# Patient Record
Sex: Male | Born: 1960 | Race: White | Hispanic: No | Marital: Married | Smoking: Never smoker
Health system: Southern US, Community
[De-identification: ages and names within clinical notes are randomized; demographics above are authoritative.]

## PROBLEM LIST (undated history)

## (undated) DIAGNOSIS — K219 Gastro-esophageal reflux disease without esophagitis: Secondary | ICD-10-CM

## (undated) DIAGNOSIS — Z905 Acquired absence of kidney: Secondary | ICD-10-CM

## (undated) DIAGNOSIS — E7800 Pure hypercholesterolemia, unspecified: Secondary | ICD-10-CM

## (undated) DIAGNOSIS — I10 Essential (primary) hypertension: Secondary | ICD-10-CM

## (undated) DIAGNOSIS — N433 Hydrocele, unspecified: Secondary | ICD-10-CM

## (undated) DIAGNOSIS — E785 Hyperlipidemia, unspecified: Secondary | ICD-10-CM

## (undated) DIAGNOSIS — Z8711 Personal history of peptic ulcer disease: Secondary | ICD-10-CM

## (undated) DIAGNOSIS — Z8719 Personal history of other diseases of the digestive system: Secondary | ICD-10-CM

## (undated) HISTORY — PX: OTHER SURGICAL HISTORY: SHX169

---

## 1996-04-17 HISTORY — PX: ESOPHAGOGASTRODUODENOSCOPY: SHX1529

## 2002-02-15 HISTORY — PX: NEPHRECTOMY LIVING DONOR: SUR877

## 2004-05-04 ENCOUNTER — Observation Stay (HOSPITAL_COMMUNITY): Admission: AD | Admit: 2004-05-04 | Discharge: 2004-05-05 | Payer: Self-pay | Admitting: Emergency Medicine

## 2004-05-04 HISTORY — PX: APPENDECTOMY: SHX54

## 2004-06-23 ENCOUNTER — Ambulatory Visit: Payer: Self-pay | Admitting: Internal Medicine

## 2004-09-19 ENCOUNTER — Ambulatory Visit: Payer: Self-pay | Admitting: Internal Medicine

## 2005-05-15 ENCOUNTER — Ambulatory Visit (HOSPITAL_COMMUNITY): Admission: RE | Admit: 2005-05-15 | Discharge: 2005-05-15 | Payer: Self-pay | Admitting: General Surgery

## 2005-06-13 ENCOUNTER — Emergency Department (HOSPITAL_COMMUNITY): Admission: EM | Admit: 2005-06-13 | Discharge: 2005-06-13 | Payer: Self-pay | Admitting: Emergency Medicine

## 2005-10-03 ENCOUNTER — Ambulatory Visit: Payer: Self-pay | Admitting: Internal Medicine

## 2006-01-15 ENCOUNTER — Other Ambulatory Visit: Admission: RE | Admit: 2006-01-15 | Discharge: 2006-01-15 | Payer: Self-pay | Admitting: Urology

## 2006-01-15 ENCOUNTER — Encounter (INDEPENDENT_AMBULATORY_CARE_PROVIDER_SITE_OTHER): Payer: Self-pay | Admitting: Specialist

## 2006-05-28 ENCOUNTER — Encounter (INDEPENDENT_AMBULATORY_CARE_PROVIDER_SITE_OTHER): Payer: Self-pay | Admitting: *Deleted

## 2006-05-28 ENCOUNTER — Ambulatory Visit (HOSPITAL_COMMUNITY): Admission: RE | Admit: 2006-05-28 | Discharge: 2006-05-28 | Payer: Self-pay | Admitting: Internal Medicine

## 2006-05-28 ENCOUNTER — Ambulatory Visit: Payer: Self-pay | Admitting: Internal Medicine

## 2006-05-28 HISTORY — PX: COLONOSCOPY: SHX174

## 2006-10-24 ENCOUNTER — Ambulatory Visit: Payer: Self-pay | Admitting: Internal Medicine

## 2006-11-15 ENCOUNTER — Ambulatory Visit (HOSPITAL_COMMUNITY): Admission: RE | Admit: 2006-11-15 | Discharge: 2006-11-15 | Payer: Self-pay | Admitting: Family Medicine

## 2007-10-30 ENCOUNTER — Ambulatory Visit: Payer: Self-pay | Admitting: Internal Medicine

## 2009-01-28 ENCOUNTER — Encounter: Payer: Self-pay | Admitting: Gastroenterology

## 2009-07-28 ENCOUNTER — Ambulatory Visit (HOSPITAL_COMMUNITY): Admission: RE | Admit: 2009-07-28 | Discharge: 2009-07-28 | Payer: Self-pay | Admitting: Internal Medicine

## 2009-10-29 ENCOUNTER — Encounter (INDEPENDENT_AMBULATORY_CARE_PROVIDER_SITE_OTHER): Payer: Self-pay

## 2010-05-17 NOTE — Letter (Signed)
Summary: Recall Office Visit  [facility]  [address]   [place], [place] [address]   Phone: [phone]  Fax: [phone]      October 29, 2009   Gregory George [address] [place], [place]  [address] 03/14/1961   Dear [name],   According to our records, it is time for you to schedule a follow-up office visit with [place].   At your convenience, please call [phone] to schedule an office visit. If you have any questions, concerns, or feel that this letter is in error, we would appreciate your call.   Sincerely,    Hendricks Limes LPN  [facility] Ph: [phone]   Fax: [phone]

## 2010-08-30 NOTE — Assessment & Plan Note (Signed)
NAME:  Gregory George, Gregory George                CHART#:  [id number]   DATE:  10/30/2007                       DOB:  1961/03/03   Followup gastroesophageal reflux disease.  Last seen on 10/24/2006.  He  has done well on Aciphex 20 mg orally daily.  If he misses a couple of  days, he has recurrent symptoms.  He has changes in his dietary habits  significantly.  He has cut back on his caffeine, using more fiber/rich.  He has gained 8 pound since last visit.  No odynophagia, dysphagia,  melena, or hematochezia.  Colonoscopy early 2008 for Hemoccult positive  stool.  No significant findings.  He is slated to have a repeat  colonoscopy for screening purposes, 2018.  Last EGD 1998, he had erosive  reflux esophagitis at that time.  No Barrett's, he has not had any  intercurrent illnesses.  He did notice some transient constipation may  which subsided on his own.   CURRENT MEDICATIONS:  See updated list.   ALLERGIES:  Vytorin.   PHYSICAL EXAMINATION:  GENERAL:  Today appears well.  VITAL SIGNS: Weight 193, height 6 feet, temperature 98.5, BP 112/84, and  pulse 60.  SKIN:  Warm and dry.  ABDOMEN:  Flat, positive bowel sounds, soft, nontender without  appreciable mass or organomegaly.   ASSESSMENT:  History of gastroesophageal reflux disease/erosive reflux  esophagitis symptoms responsive and dependent on Aciphex, reviewed  antireflux lifestyle/diet.   RECOMMENDATIONS:  1. We will continue Aciphex 20 mg orally daily unless some thing comes      up.  Plan is to see this nice gentleman back in 2 years.  2. We will slate him for routine screening colonoscopy, 2018.       Jonathon Bellows, M.D.  Electronically Signed     RMR/MEDQ  D:  10/30/2007  T:  10/30/2007  Job:  [id number]   cc:   Kirk Ruths, M.D.

## 2010-08-30 NOTE — Assessment & Plan Note (Signed)
NAME:  Gregory George, Gregory George                CHART#:  [id number]   DATE:  10/24/2006                       DOB:  1960-05-17   Followup GERD/gastroesophageal reflux esophagitis, altered bowel habit,  Hemoccult-positive stool.  Last seen 05/28/2006 at which time he  underwent a colonoscopy for vague, intermittent lower abdominal cramps  and green bowel movements.  Occult blood positive on digital rectal  exam done through my office.  Colonoscopy demonstrated anal pillar and  internal hemorrhoids, otherwise normal rectal diminutive left colon  polyp which the biopsy turned out to be negative.  Remainder of the  colon and the terminal ileum appeared normal.  He was started on some  Benefiber and a probiotic in the way of Activa.  He tells me that since  he has significantly modified his diet, his bowel function has  normalized.  Specifically, he has cut back to 1 cup of coffee daily  (this is a decrease in coffee by 11-15 cups in a 24 hour period).  He  eats oatmeal bars and oatmeal every day, and fresh fruit at night during  his third shift work schedule.  He takes Activa daily as well.  He has 1-  4 bowel movements daily, doing very well.  Reflux symptoms have pretty  much abolished with Aciphex 20 mg orally daily.  He has not had any  rectal bleeding.  There is no family history of GI neoplasia.  He does  takes Bentyl on occasion for bloating.  He may take 1 on the order of  every other day.  He has not had any significant intercurrent medical  illnesses.  Last EGD was in 1998 and he was found to have mild erosive  reflux esophagitis.   CURRENT MEDICATIONS:  See updated list.   ALLERGIES:  VYTORIN.   PHYSICAL EXAMINATION:  He looks well.  Weight 175 which is actually up 6 pounds from 1 year ago.  Height 6  feet, temp 98.2, BP 120/82, pulse 60.  CHEST:  Clear to auscultation.  CARDIAC EXAM:  Regular rate and rhythm without murmurs, gallops, or  rubs.  ABDOMEN:  Flat, positive bowel sounds,  soft, nontender without  appreciable mass or organomegaly.   ASSESSMENT:  Altered bowel function now normalized with dramatic  decrease in caffeine consumption.  He has increased daily fiber intake.  He was commended for his accomplishments in these areas.  Gastroesophageal reflux disease symptoms are well controlled on Aciphex.  He may be able to drop back to 1 every other day, or actually 2  __________ on a p.r.n. basis with his markedly improved diet.  I have  given him a refill on  Aciphex 20 mg daily #30 for 1 year.  Unless something comes up, plan to  see this nice gentleman back in 1 year and see how he is doing.       Jonathon Bellows, M.D.  Electronically Signed     RMR/MEDQ  D:  10/24/2006  T:  10/24/2006  Job:  [id number]   cc:   Kirk Ruths, M.D.

## 2010-09-02 NOTE — Op Note (Signed)
NAME:  Gregory George, Gregory George               ACCOUNT NO.:  [id number]   MEDICAL RECORD NO.:  [id number]          PATIENT TYPE:  AMB   LOCATION:  DAY                           FACILITY:  [facility]   PHYSICIAN:  Dalia Heading, M.D.  DATE OF BIRTH:  Aug 01, 1960   DATE OF PROCEDURE:  05/15/2005  DATE OF DISCHARGE:                                 OPERATIVE REPORT   PREOPERATIVE DIAGNOSIS:  Left inguinal hernia.   POSTOPERATIVE DIAGNOSIS:  Left inguinal hernia, direct.   PROCEDURE:  Left inguinal herniorrhaphy.   SURGEON:  Dalia Heading, M.D.   ANESTHESIA:  General endotracheal.   INDICATIONS:  The patient is a 50 year old, white male who presents with a  symptomatic left inguinal hernia. The risks and benefits of the procedure  including bleeding, infection, pain, and recurrence of the hernia were fully  explained to the patient, who gave informed consent.   PROCEDURE NOTE:  The patient was placed in supine position. After induction  of general endotracheal anesthesia, the left groin region was prepped and  draped using the usual sterile technique with Betadine. Surgical site  confirmation was performed.   A transverse incision was made in the left groin region down to the external  oblique aponeurosis.  The aponeurosis was incised to the external ring. The  ilioinguinal nerve was identified and retracted superiorly from the  operative field. A Penrose drain was placed around the spermatic cord. A  direct hernia was found. This was incised at its base and inverted. A large-  size polypropylene mesh plug was then placed into this region secured  circumferentially to the transversalis fascia using 2-0 Novofil interrupted  sutures. An onlay polypropylene mesh patch was then placed along the floor  of the inguinal canal and secured superiorly to the conjoined tendon and  inferiorly to the shelving edge Poupart's ligament using 2-0 Novofil  interrupted sutures. The internal ring was recreated using  a 2-0 Novofil  interrupted suture. The external oblique aponeurosis was reapproximated  using a 2-0 Vicryl running suture. The subcutaneous layer was reapproximated  using a 3-0 Vicryl interrupted suture. The skin was closed using a 4-0  Vicryl subcuticular suture. Then 0.5 cm Sensorcaine was instilled into the  surrounding wound. Dermabond was then applied.   All tape and needle counts were correct at the end of the procedure. The  patient was extubated in the operating room and went back to recovery room  awake in stable condition.   COMPLICATIONS:  None.   SPECIMEN:  None.   BLOOD LOSS:  Minimal.      Dalia Heading, M.D.  Electronically Signed     MAJ/MEDQ  D:  05/15/2005  T:  05/15/2005  Job:  [id number]   cc:   Patrica Duel, M.D.  Fax: [phone]

## 2010-09-02 NOTE — Op Note (Signed)
NAME:  Gregory George, Gregory George NO.:  [id number]   MEDICAL RECORD NO.:  [id number]          PATIENT TYPE:  EMS   LOCATION:  ED                            FACILITY:  [facility]   PHYSICIAN:  Dalia Heading, M.D.  DATE OF BIRTH:  May 20, 1960   DATE OF PROCEDURE:  05/04/2004  DATE OF DISCHARGE:                                 OPERATIVE REPORT   PREOPERATIVE DIAGNOSIS:  Acute appendicitis.   POSTOPERATIVE DIAGNOSIS:  Acute appendicitis.   OPERATION/PROCEDURE:  Laparoscopic appendectomy.   SURGEON:  Dalia Heading, M.D.   ANESTHESIA:  General endotracheal anesthesia.   INDICATIONS:  The patient is a 50 year old white male who presented with  right lower quadrant abdominal pain to the emergency room. CT scan of the  abdomen and pelvis was performed which revealed  acute appendicitis.  The  patient now comes to the operating room for laparoscopic appendectomy.  The  risks and benefits for the procedure including bleeding, infection and the  possibility of an open procedure were fully explained to the patient who  gave informed consent.   DESCRIPTION OF PROCEDURE:  The patient was placed in the supine position.  After induction of general endotracheal anesthesia, the abdomen was prepped  and draped using the usual sterile technique with Betadine.  Surgical site  confirmation was performed.   A subumbilical incision was made down to the fascia.  A Veress needle was  introduced into the abdominal cavity and confirmation of placement was done  with the saline drop test.  The abdomen was then insufflated to 16 mmHg  pressure.  An 11 mm trocar was introduced into the abdominal under direct  visualization without difficulty.  The patient was placed in deeper  Trendelenburg position.  An additional 12 mm trocar was placed in the  suprapubic region.  A 5 mm trocar was placed in the left lower quadrant  region.  The appendix was visualized and noted to be inflamed along its  distal  two-thirds.  The mesoappendix was divided using the Harmonic scalpel.  A vascular endo-GIA was placed across the base of the appendix and fired.  The appendix was delivered through the suprapubic trocar site using an  EndoCatch bag.  The staple line and mesentery were inspected and no abnormal  bleeding or purulent drainage was noted.  All fluid and air were then  evacuated from the abdominal cavity prior to removal of the trocars.   All wounds were irrigated with normal saline.  All wounds were injected with  0.5% Sensorcaine.  The subumbilical fascia as well as the suprapubic fascia  reapproximated using an 0 Vicryl interrupted sutures.  All skin incisions  were closed using staples.  Betadine ointment and dry sterile dressings were  applied.  All tape and needle counts were correct at the end of the  procedure.  The patient was extubated in the operating room, went back to  the recovery room, awake, in stable condition.   COMPLICATIONS:  None.   SPECIMENS:  Appendix.   ESTIMATED BLOOD LOSS:  Minimal.     Gregory George   MAJ/MEDQ  D:  05/04/2004  T:  05/05/2004  Job:  [id number]   cc:   Patrica Duel, M.D.  [address]  [place]  [place] [address]  Fax: [phone]

## 2010-09-02 NOTE — Op Note (Signed)
NAME:  Gregory George, Gregory George               ACCOUNT NO.:  [id number]   MEDICAL RECORD NO.:  [id number]          PATIENT TYPE:  AMB   LOCATION:  DAY                           FACILITY:  [facility]   PHYSICIAN:  R. Roetta Sessions, M.D. DATE OF BIRTH:  04/03/1961   DATE OF PROCEDURE:  05/28/2006  DATE OF DISCHARGE:                               OPERATIVE REPORT   PROCEDURE:  Colonoscopy, cold biopsy.   INDICATIONS FOR PROCEDURE:  The patient is a 50 year old gentleman with  nonspecific GI symptoms including intermittent lower abdominal cramps,  intermittently green bowel movements.  He really denies any frank  diarrhea, occasional nausea but no vomiting. Gastroesophageal reflux  disease symptoms well-controlled.  He has gained weight recently.  He  was hemoccult positive on digital rectal exam by me in my office  recently.  Colonoscopy is now being done.  This approach has been  discussed with the patient at length.  Potential risks, benefits and  alternatives have been reviewed, questions answered.  He is agreeable.  Please note that he has never had his lower GI tract evaluated directly  optically previously he did have a barium enema in the past.  There is  no family history of colorectal neoplasia.   PROCEDURE NOTE:  O2 saturation, blood pressure, pulse and respirations  were monitored throughout the entire procedure.   CONSCIOUS SEDATION:  Versed 3 mg IV, Demerol 75 mg IV in divided doses.   INSTRUMENT:  Pentax video chip system.   FINDINGS:  Digital rectal exam revealed no abnormalities.   ENDOSCOPIC FINDINGS:  The prep was adequate.  The colonic mucosa was  surveyed from the rectosigmoid junction.  The scope was advanced through  the left, transverse, right colon to the area of the appendiceal  orifice, ileocecal valve and cecum.  These structures well seen  photographed for the record.  From this level scope was withdrawn.  All  previously mentioned mucosal surfaces were again seen.  The  patient had  two 3 mm polyps in the mid descending colon.  Remainder of colon mucosa  appeared normal.  These polyps were cold biopsied/removed.  The terminal  ileum was intubated to 15 cm.  This segment of the GI tract also  appeared normal.  The remainder of the colonic mucosa appeared entirely  normal.  Scope was pulled down into the rectum where a thorough  examination of the rectal mucosa including a retroflex view of the anal  verge was undertaken.  The patient had anal papilla and minimal internal  hemorrhoids, otherwise the rectal mucosa appeared normal.  The patient  tolerated the procedure well, was reacted in endoscopy.   IMPRESSION:  Anal papilla and internal hemorrhoids, otherwise normal  rectum.  Diminutive left colon polyps removed as described above.  Otherwise normal colon, normal terminal ileum.   Suspect the patient has an element of irritable bowel syndrome.   RECOMMENDATIONS:  1. Follow-up on path.  2. Begin and daily Benefiber fiber supplement.  Also will begin      probiotic in the way of __________  one  capsule daily.  Will  follow-up on path.  Will arrange for this nice gentleman come back      to see [place] in 3 months so we can seem how he is doing.      Gregory George, M.D.  Electronically Signed     RMR/MEDQ  D:  05/28/2006  T:  05/28/2006  Job:  [id number]   cc:   Corrie Mckusick, M.D.  Fax: [phone]

## 2010-09-02 NOTE — H&P (Signed)
NAME:  Gregory George, Gregory George               ACCOUNT NO.:  [id number]   MEDICAL RECORD NO.:  [id number]          PATIENT TYPE:  AMB   LOCATION:                                FACILITY:  [facility]   PHYSICIAN:  Dalia Heading, M.D.  DATE OF BIRTH:  [birth year]   DATE OF ADMISSION:  DATE OF DISCHARGE:  LH                                HISTORY & PHYSICAL   CHIEF COMPLAINT:  Left inguinal hernia.   HISTORY OF PRESENT ILLNESS:  The patient is a 50 year old white male who  presents with a left inguinal hernia.  It is increasing in size and is  causing him discomfort when straining.  No nausea or vomiting have been  noted.   PAST MEDICAL HISTORY:  Unremarkable.   PAST SURGICAL HISTORY:  Laparoscopic appendectomy in 2006.   CURRENT MEDICATIONS:  None.   ALLERGIES:  No known drug allergies.   REVIEW OF SYSTEMS:  Noncontributory.   PHYSICAL EXAMINATION:  GENERAL:  The patient is a well-developed, well-  nourished, white male in no acute distress.  LUNGS:  Clear to auscultation with equal breath sounds bilaterally.  HEART:  Reveals a regular rate and rhythm without S3, S4, or murmurs.  ABDOMEN:  Soft, nontender, nondistended.  No hepatosplenomegaly or masses  are noted.  A reducible left inguinal hernia is noted.  No right inguinal  hernia is noted.   IMPRESSION:  Left inguinal hernia.   PLAN:  The patient is scheduled for a left inguinal herniorrhaphy on May 15, 2005.  The risks and benefits of the procedure including bleeding,  infection, pain, and recurrence of the hernia were fully explained to the  patient, gave informed consent.      Dalia Heading, M.D.  Electronically Signed     MAJ/MEDQ  D:  04/25/2005  T:  04/25/2005  Job:  [id number]   cc:   Dalia Heading, M.D.  Fax: [phone]   [facility]  Fax: [phone]   Patrica Duel, M.D.  Fax: [phone]

## 2010-09-02 NOTE — H&P (Signed)
NAME:  Gregory George, Gregory George               ACCOUNT NO.:  [id number]   MEDICAL RECORD NO.:  [id number]          PATIENT TYPE:  AMB   LOCATION:                                FACILITY:  [facility]   PHYSICIAN:  R. Roetta Sessions, M.D. DATE OF BIRTH:  [birth year]   DATE OF ADMISSION:  DATE OF DISCHARGE:  LH                              HISTORY & PHYSICAL   REASON FOR CONSULTATION:  Abdominal pain, change in bowel function.   HISTORY OF PRESENT ILLNESS:  Gregory George is a 50 year old  Caucasian male sent over at the courtesy of Dr. Phillips Odor to further  evaluate recent change in bowel function.  He is well known to me given  his history of gastroesophageal reflux disease.   He tells me around early December 2007 he started having vague lower  abdominal cramps from time to time, intermittent nausea but has never  vomited.  Had some increased belching and flatulence along the way too.  Noted some dark green stools from time to time, nothing that he really  describes as melena.  He has not had any hematochezia.  He has  intermittent abdominal cramps, not necessarily relieved with a bowel  movement.   He has GAINED 9 pounds since his last weigh-in here in June, 2007.  He  has had a barium enema in years past, was diagnosed with irritable bowel  back in the 1970's.  He had an air contrast barium enema back at that  time.  He has never had his lower GI tract imaged optically.   Reflux symptoms continue to be well controlled on Aciphex 20 mg orally  daily.  Dr. 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  He was  started on Bentyl which has made not much difference in his symptoms as  he only takes it 1-2 times daily.  He has been taking over-the-counter  fiber supplement on a daily basis.   He denies odynophagia, dysphagia, early satiety or vomiting.  No  one  else around him was sick recently.  He has had no fever or chills.   PAST MEDICAL HISTORY:  Significant for:  1. Gastroesophageal reflux disease.  2. Irritable bowel syndrome.  3. History of hyperlipidemia.   PAST SURGERIES:  1. Urethral surgery as an infant.  2. Vasectomy October of 2007.  3. Laparoscopic appendectomy for appendicitis back in 2006, Dr.      Lovell Sheehan.   CURRENT MEDICATIONS:  1. Multivitamin daily.  2. Vitamin C daily.  3. Tylenol p.r.n.  4. ASA 81 mg daily.  5. Aciphex 20 mg daily.  6. Zolpidem q.h.s.  7. Bentyl p.r.n.   ALLERGIES:  VYTORIN.   FAMILY HISTORY:  1. Negative for chronic GI or liver illness.  2. No history of ulcerative colitis, Crohn's or GI cancer      specifically.   SOCIAL HISTORY:  1. Patient is married and has one son and one daughter.  2. He works third  shift at __________.  3. He stopped smoking April 17, 2006.  4. He does not use alcohol or illicit drugs.   REVIEW OF SYSTEMS:  No chest pain, no dyspnea on exertion.  No fever or  chills.  Otherwise as in History of Present Illness.   PHYSICAL EXAMINATION:  Reveals a pleasant 50 year old gentleman resting  comfortably.  He is bearded.  Weight 178, height 6 feet, temperature  98.5, BP 120/84, pulse 60.  Skin warm and dry, there is no jaundice or  __________ stigmata of chronic liver disease.  HEENT:  No scleral icterus.  Conjunctiva are pink, oral cavity no  lesions.  CHEST:  Lungs are clear to auscultation.  ABDOMEN:  Nondistended.  Positive bowel sounds, soft.  He has minimal  right lower quadrant tenderness to palpation, no appreciable mass or  organomegaly.  EXTREMITY:  No edema.  RECTAL:  Good sphincter tone, no mass in the rectal vault.  Scant brown  stool in the rectal vault is hemoccult positive.   IMPRESSION:  Gregory George is a pleasant 50 year old gentleman with  vague nonspecific GI symptoms as outlined above.  These symptoms have  occurred recently.  They  do not really fall into any classic or typical  IBS profile.  He is hemoccult positive.  He may have had a recent  foodborne illness which was self-limiting.  At any rate, he needs to  have his lower GI tract evaluated via colonoscopy.  I have discussed  this approach with Gregory George.  The potential risks, benefits and  alternatives have been reviewed, questions answered, he is agreeable.   He is concerned about history of elevated creatinine given a solitary  kidney.  He asked that I recheck labs on him today.  Will go ahead and  do a Chem 20 and a CBC and see how things look.  I will make further  recommendations in the very near future.  He was commended for smoking  cessation.   I would like to thank Dr. Geanie Cooley for allowing me to see this nice  gentleman.      Gregory George, M.D.  Electronically Signed     RMR/MEDQ  D:  05/22/2006  T:  05/22/2006  Job:  [id number]   cc:   Corrie Mckusick, M.D.  Fax: [phone]

## 2010-09-14 ENCOUNTER — Other Ambulatory Visit (HOSPITAL_COMMUNITY): Payer: Self-pay | Admitting: Family Medicine

## 2010-09-14 ENCOUNTER — Ambulatory Visit (HOSPITAL_COMMUNITY)
Admission: RE | Admit: 2010-09-14 | Discharge: 2010-09-14 | Disposition: A | Discharge status: Home / Self Care | Payer: 59 | Source: Ambulatory Visit | Attending: Family Medicine | Admitting: Family Medicine

## 2010-09-14 DIAGNOSIS — N433 Hydrocele, unspecified: Secondary | ICD-10-CM | POA: Insufficient documentation

## 2010-09-14 DIAGNOSIS — N508 Other specified disorders of male genital organs: Secondary | ICD-10-CM

## 2010-09-14 DIAGNOSIS — N509 Disorder of male genital organs, unspecified: Secondary | ICD-10-CM | POA: Insufficient documentation

## 2012-02-02 ENCOUNTER — Other Ambulatory Visit: Payer: Self-pay | Admitting: Urology

## 2012-02-21 ENCOUNTER — Encounter (HOSPITAL_BASED_OUTPATIENT_CLINIC_OR_DEPARTMENT_OTHER): Payer: Self-pay | Admitting: *Deleted

## 2012-02-23 ENCOUNTER — Encounter (HOSPITAL_BASED_OUTPATIENT_CLINIC_OR_DEPARTMENT_OTHER): Payer: Self-pay | Admitting: *Deleted

## 2012-02-23 NOTE — H&P (Signed)
History of Present Illness   Gregory George presents today as a referral from [facility] for possible surgical correction of a left hydrocele. Gregory George is 51 years of age. Only urologic history is a remote history of vasectomy. The patient also has had a left inguinal hernia repair. Approximately a year to a year-and-half ago, he began noticing some left-sided scrotal swelling. An ultrasound was performed, which documented a left-sided hydrocele. This actually got smaller for a while, but then subsequently has increased in size. 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 He is interested in Air cabin crew.      Past Medical History Problems  1. History of  Gastric Cancer V10.04 2. History of  Heartburn 787.1 3. History of  Hyperlipidemia 272.4  Surgical History Problems  1. History of  Appendectomy 2. 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 History of  Hernia Repair 4. History of  Surgery Of Male Genitalia Vasectomy V25.2  Current Meds 1. Aciphex 20 MG Oral Tablet Delayed Release; Therapy: (Recorded:14Oct2013) to 2. Adult Aspirin Low Strength 81 MG Oral Tablet Dispersible; Therapy: (Recorded:14Oct2013) to 3. Ambien 10 MG Oral Tablet; Therapy: (Recorded:14Oct2013) to 4. Fish Oil 1000 MG Oral Capsule; Therapy: (Recorded:14Oct2013) to 5. Simvastatin 20 MG Oral Tablet; Therapy: (Recorded:14Oct2013) to 6. Vitamin B Complex Oral Tablet; Therapy: (Recorded:14Oct2013) to 7. Vitamin C 500 MG Oral Tablet; Therapy: (Recorded:14Oct2013) to  Allergies Medication  1. Vytorin TABS  Family History Problems  1. Paternal grandmother's history of  Alzheimer's Disease 2. Paternal history of  Family Health Status - Father's Age 80yrs 3. Maternal history of  Family Health Status - Mother's Age 59yrs 4. Family history of  Family Health Status Number Of Children 1 son and 1 daughter 5. Paternal history of  Hypertension  V17.49 6. Paternal grandmother's history of  Hypertension V17.49 7. Maternal history of  Nephrolithiasis 8. Paternal history of  Renal Failure 9. 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  Genitourinary: testicular pain and scrotal swelling.  Gastrointestinal: heartburn and constipation.  Constitutional: feeling tired (fatigue).  Musculoskeletal: back pain.    Vitals Vital Signs [Data Includes: Last 1 Day]  14Oct2013 02:06PM  BMI Calculated: 24.24 BSA Calculated: 2.03 Height: 6 ft  Weight: 179 lb  Blood Pressure: 138 / 82 Temperature: 98.2 F Heart Rate: 67  Physical Exam Constitutional: Well nourished and well developed . No acute distress.  ENT:. The ears and nose are normal in appearance.  Neck: The appearance of the neck is normal and no neck mass is present.  Pulmonary: No respiratory distress and normal respiratory rhythm and effort.  Cardiovascular: Heart rate and rhythm are normal . No peripheral edema.  Abdomen: The abdomen is soft and nontender. No masses are palpated. No CVA tenderness. No hernias are palpable. No hepatosplenomegaly noted.  Genitourinary: Examination of the penis demonstrates no discharge, no masses, no lesions and a normal meatus. The scrotum is without lesions. Examination of the left scrotum demostrates a hydrocele. The right epididymis is palpably normal and  non-tender. The left epididymis is palpably normal and non-tender. The right testis is non-tender and without masses.  The left testis is nonpalpable, non-tender and without masses.  Skin: Normal skin turgor, no visible rash and no visible skin lesions.  Neuro/Psych:. Mood and affect are appropriate.    Results/Data Urine [Data Includes: Last 1 Day]   14Oct2013  COLOR YELLOW   APPEARANCE CLEAR   SPECIFIC GRAVITY 1.020   pH 6.0   GLUCOSE NEG mg/dL  BILIRUBIN NEG   KETONE NEG mg/dL  BLOOD NEG   PROTEIN NEG mg/dL  UROBILINOGEN 0.2 mg/dL  NITRITE NEG   LEUKOCYTE ESTERASE NEG    Assessment Assessed  1. Testicular Pain 608.9 2. Testicular Hydrocele 603.9  Plan Health Maintenance (V70.0)  1. UA With REFLEX  Done: 14Oct2013 01:51PM Testicular Hydrocele (603.9)  2. Follow-up Schedule Surgery Office  Follow-up  Requested for: 14Oct2013  Discussion/Summary   Gregory George has a symptomatic small to medium sized hydrocele. This does bother him, and it is impacting him on a work as well as a quality-of-life basis. He understands that medically nothing has to be done, but given the symptomatic status, surgical correction is certainly reasonable. We discussed the procedure, typical recovery, pros and cons of this approach. He is interested in scheduling something, which we will attempt to do sometime in the next several weeks.

## 2012-02-23 NOTE — Progress Notes (Signed)
NPO AFTER MN. ARRIVES AT 0900. NEEDS HG AND EKG. WILL TAKE ZANTAC AM OF SURG W/ SIP OF WATER.

## 2012-02-26 ENCOUNTER — Ambulatory Visit (HOSPITAL_BASED_OUTPATIENT_CLINIC_OR_DEPARTMENT_OTHER): Payer: 59 | Admitting: Anesthesiology

## 2012-02-26 ENCOUNTER — Other Ambulatory Visit: Payer: Self-pay

## 2012-02-26 ENCOUNTER — Encounter (HOSPITAL_BASED_OUTPATIENT_CLINIC_OR_DEPARTMENT_OTHER): Payer: Self-pay | Admitting: Anesthesiology

## 2012-02-26 ENCOUNTER — Ambulatory Visit (HOSPITAL_BASED_OUTPATIENT_CLINIC_OR_DEPARTMENT_OTHER)
Admission: RE | Admit: 2012-02-26 | Discharge: 2012-02-26 | Disposition: A | Discharge status: Home / Self Care | Payer: 59 | Source: Ambulatory Visit | Attending: Urology | Admitting: Urology

## 2012-02-26 ENCOUNTER — Encounter (HOSPITAL_BASED_OUTPATIENT_CLINIC_OR_DEPARTMENT_OTHER): Payer: Self-pay | Admitting: *Deleted

## 2012-02-26 ENCOUNTER — Encounter (HOSPITAL_BASED_OUTPATIENT_CLINIC_OR_DEPARTMENT_OTHER)
Admission: RE | Disposition: A | Discharge status: Home / Self Care | Payer: Self-pay | Source: Ambulatory Visit | Attending: Urology

## 2012-02-26 DIAGNOSIS — Z79899 Other long term (current) drug therapy: Secondary | ICD-10-CM | POA: Insufficient documentation

## 2012-02-26 DIAGNOSIS — Z85028 Personal history of other malignant neoplasm of stomach: Secondary | ICD-10-CM | POA: Insufficient documentation

## 2012-02-26 DIAGNOSIS — N433 Hydrocele, unspecified: Secondary | ICD-10-CM

## 2012-02-26 DIAGNOSIS — E785 Hyperlipidemia, unspecified: Secondary | ICD-10-CM | POA: Insufficient documentation

## 2012-02-26 HISTORY — DX: Hyperlipidemia, unspecified: E78.5

## 2012-02-26 HISTORY — DX: Gastro-esophageal reflux disease without esophagitis: K21.9

## 2012-02-26 HISTORY — PX: HYDROCELE EXCISION: SHX482

## 2012-02-26 HISTORY — DX: Personal history of other diseases of the digestive system: Z87.19

## 2012-02-26 HISTORY — DX: Hydrocele, unspecified: N43.3

## 2012-02-26 HISTORY — DX: Personal history of peptic ulcer disease: Z87.11

## 2012-02-26 HISTORY — DX: Acquired absence of kidney: Z90.5

## 2012-02-26 SURGERY — HYDROCELECTOMY
Anesthesia: General | Site: Scrotum | Laterality: Left | Wound class: Clean

## 2012-02-26 MED ORDER — LACTATED RINGERS IV SOLN
INTRAVENOUS | Status: DC
  Administered 2012-02-26: 09:00:00 via INTRAVENOUS
  Filled 2012-02-26: qty 1000

## 2012-02-26 MED ORDER — CEFAZOLIN SODIUM 1-5 GM-% IV SOLN
1.0000 g | INTRAVENOUS | Status: DC
  Filled 2012-02-26: qty 50

## 2012-02-26 MED ORDER — LIDOCAINE HCL (CARDIAC) 20 MG/ML IV SOLN
INTRAVENOUS | Status: DC | PRN
  Administered 2012-02-26: 100 mg via INTRAVENOUS

## 2012-02-26 MED ORDER — CEFAZOLIN SODIUM-DEXTROSE 2-3 GM-% IV SOLR
2.0000 g | INTRAVENOUS | Status: AC
  Administered 2012-02-26: 2 g via INTRAVENOUS
  Filled 2012-02-26: qty 50

## 2012-02-26 MED ORDER — BUPIVACAINE HCL (PF) 0.25 % IJ SOLN
INTRAMUSCULAR | Status: DC | PRN
  Administered 2012-02-26: 5 mL

## 2012-02-26 MED ORDER — LACTATED RINGERS IV SOLN: INTRAVENOUS | Status: DC

## 2012-02-26 MED ORDER — FENTANYL CITRATE 0.05 MG/ML IJ SOLN
INTRAMUSCULAR | Status: DC | PRN
  Administered 2012-02-26 (×3): 50 ug via INTRAVENOUS

## 2012-02-26 MED ORDER — HYDROCODONE-ACETAMINOPHEN 5-325 MG PO TABS: 1.0000 | ORAL_TABLET | Freq: Four times a day (QID) | ORAL | Status: DC | PRN

## 2012-02-26 MED ORDER — DEXAMETHASONE SODIUM PHOSPHATE 4 MG/ML IJ SOLN
INTRAMUSCULAR | Status: DC | PRN
  Administered 2012-02-26: 10 mg via INTRAVENOUS

## 2012-02-26 MED ORDER — FENTANYL CITRATE 0.05 MG/ML IJ SOLN: 25.0000 ug | INTRAMUSCULAR | Status: DC | PRN

## 2012-02-26 MED ORDER — ONDANSETRON HCL 4 MG/2ML IJ SOLN
INTRAMUSCULAR | Status: DC | PRN
  Administered 2012-02-26: 4 mg via INTRAVENOUS

## 2012-02-26 MED ORDER — MIDAZOLAM HCL 5 MG/5ML IJ SOLN
INTRAMUSCULAR | Status: DC | PRN
  Administered 2012-02-26: 2 mg via INTRAVENOUS

## 2012-02-26 MED ORDER — PROPOFOL 10 MG/ML IV BOLUS
INTRAVENOUS | Status: DC | PRN
  Administered 2012-02-26: 200 mg via INTRAVENOUS

## 2012-02-26 SURGICAL SUPPLY — 48 items
APL SKNCLS STERI-STRIP NONHPOA (GAUZE/BANDAGES/DRESSINGS) ×1
BANDAGE GAUZE ELAST BULKY 4 IN (GAUZE/BANDAGES/DRESSINGS) ×2 IMPLANT
BENZOIN TINCTURE PRP APPL 2/3 (GAUZE/BANDAGES/DRESSINGS) ×2 IMPLANT
BLADE SURG 10 STRL SS (BLADE) ×2 IMPLANT
BLADE SURG 15 STRL LF DISP TIS (BLADE) ×1 IMPLANT
BLADE SURG 15 STRL SS (BLADE) ×2
BLADE SURG ROTATE 9660 (MISCELLANEOUS) ×1 IMPLANT
CANISTER SUCTION 1200CC (MISCELLANEOUS) IMPLANT
CANISTER SUCTION 2500CC (MISCELLANEOUS) ×1 IMPLANT
CLEANER CAUTERY TIP 5X5 PAD (MISCELLANEOUS) ×1 IMPLANT
CLOTH BEACON ORANGE TIMEOUT ST (SAFETY) ×2 IMPLANT
COVER MAYO STAND STRL (DRAPES) ×2 IMPLANT
COVER TABLE BACK 60X90 (DRAPES) ×2 IMPLANT
DRAIN PENROSE 18X1/4 LTX STRL (WOUND CARE) IMPLANT
DRAPE PED LAPAROTOMY (DRAPES) ×2 IMPLANT
DRSG TEGADERM 2-3/8X2-3/4 SM (GAUZE/BANDAGES/DRESSINGS) IMPLANT
ELECT NDL TIP 2.8 STRL (NEEDLE) ×1 IMPLANT
ELECT NEEDLE TIP 2.8 STRL (NEEDLE) ×2 IMPLANT
ELECT REM PT RETURN 9FT ADLT (ELECTROSURGICAL) ×2
ELECTRODE REM PT RTRN 9FT ADLT (ELECTROSURGICAL) ×1 IMPLANT
GLOVE BIO SURGEON STRL SZ7.5 (GLOVE) ×2 IMPLANT
GLOVE BIOGEL M 6.5 STRL (GLOVE) ×1 IMPLANT
GLOVE BIOGEL PI IND STRL 6.5 (GLOVE) IMPLANT
GLOVE BIOGEL PI INDICATOR 6.5 (GLOVE) ×2
GOWN W/COTTON TOWEL STD LRG (GOWNS) ×2 IMPLANT
GOWN XL W/COTTON TOWEL STD (GOWNS) ×2 IMPLANT
IV NS 500ML (IV SOLUTION) ×2
IV NS 500ML BAXH (IV SOLUTION) IMPLANT
NDL HYPO 25X1 1.5 SAFETY (NEEDLE) ×1 IMPLANT
NEEDLE HYPO 25X1 1.5 SAFETY (NEEDLE) ×2 IMPLANT
NS IRRIG 500ML POUR BTL (IV SOLUTION) ×2 IMPLANT
PACK BASIN DAY SURGERY FS (CUSTOM PROCEDURE TRAY) ×2 IMPLANT
PAD CLEANER CAUTERY TIP 5X5 (MISCELLANEOUS) ×1
PENCIL BUTTON HOLSTER BLD 10FT (ELECTRODE) ×2 IMPLANT
SUPPORT SCROTAL LG STRP (MISCELLANEOUS) IMPLANT
SUT SILK 3 0 SH 30 (SUTURE) IMPLANT
SUT VIC AB 3-0 SH 27 (SUTURE) ×4
SUT VIC AB 3-0 SH 27X BRD (SUTURE) IMPLANT
SUT VIC AB 4-0 SH 27 (SUTURE)
SUT VIC AB 4-0 SH 27XANBCTRL (SUTURE) IMPLANT
SUT VIC AB 5-0 P-3 18X BRD (SUTURE) IMPLANT
SUT VIC AB 5-0 P3 18 (SUTURE)
SUT VICRYL 4-0 PS2 18IN ABS (SUTURE) IMPLANT
SYR CONTROL 10ML LL (SYRINGE) ×2 IMPLANT
TOWEL OR 17X24 6PK STRL BLUE (TOWEL DISPOSABLE) ×4 IMPLANT
TRAY DSU PREP LF (CUSTOM PROCEDURE TRAY) ×2 IMPLANT
TUBE CONNECTING 12X1/4 (SUCTIONS) ×2 IMPLANT
YANKAUER SUCT BULB TIP NO VENT (SUCTIONS) ×2 IMPLANT

## 2012-02-26 NOTE — Op Note (Signed)
Preoperative diagnosis: Left testicular hydrocele  Postoperative diagnosis: Same  Procedure: Hydrocele repair  Surgeon: Ayven Pheasant S. Nylia Gavina M.D.  Anesthesia: Gen.  Indications: Patient presented with scrotal swelling. A hydrocele was confirmed with scrotal ultrasonography. The patient was symptomatic from his hydrocele and requested surgical intervention. He appeared to understand the risks benefits potential complications of this procedure. The patient has received perioperative antibiotics and placement of PAS compression boots.  Technique and findings: Patient was brought to the operating room where he had successful induction of general anesthesia. The scrotum was then prepped and draped in the usual manner. Appropriate surgical timeout was performed. An incision was made in the median raphae of the scrotum. A tense hydrocele was encountered which was freed from the scrotal wall and then the testis and hydrocele sac were delivered from the incision. The hydrocele sac was then incised and a large amount of clear yellow fluid was obtained. The testis was carefully inspected and no other pathology was appreciated. The hydrocele sac was moderate in thickness. Some of the redundant sac was excised with electrocautery. The sac was then plicated with some 3-0 Vicryl suture. The spermatic cord block was then performed with quarter percent Marcaine. The testis was returned to the hemiscrotum taking great care to make sure that there was no twisting of the spermatic cord. The scrotal compartment was then copiously irrigated. The scrotal incision was then closed with multiple layers of Vicryl suture. A Tegaderm dressing was applied. Sponge and needle counts were correct. No obvious complications occurred and the patient was brought to recovery room in stable condition.        

## 2012-02-26 NOTE — Transfer of Care (Signed)
Immediate Anesthesia Transfer of Care Note  Patient: Gregory George  Procedure(s) Performed: Procedure(s) (LRB): HYDROCELECTOMY ADULT (Left)  Patient Location: PACU  Anesthesia Type: General  Level of Consciousness: awake, alert  and oriented  Airway & Oxygen Therapy: Patient Spontanous Breathing and Patient connected to face mask oxygen  Post-op Assessment: Report given to PACU RN and Post -op Vital signs reviewed and stable  Post vital signs: Reviewed and stable  Complications: No apparent anesthesia complications

## 2012-02-26 NOTE — Interval H&P Note (Signed)
History and Physical Interval Note:  02/26/2012 9:59 AM  Gregory George  has presented today for surgery, with the diagnosis of Left Hydrocele  The various methods of treatment have been discussed with the patient and family. After consideration of risks, benefits and other options for treatment, the patient has consented to  Procedure(s) (LRB) with comments: HYDROCELECTOMY ADULT (Left) - 1 hour requested for this case as a surgical intervention .  The patient's history has been reviewed, patient examined, no change in status, stable for surgery.  I have reviewed the patient's chart and labs.  Questions were answered to the patient's satisfaction.     [name]

## 2012-02-26 NOTE — Anesthesia Preprocedure Evaluation (Addendum)
Anesthesia Evaluation  Patient identified by MRN, date of birth, ID band Patient awake    Reviewed: Allergy & Precautions, H&P , NPO status , Patient's Chart, lab work & pertinent test results  Airway Mallampati: II TM Distance: >3 FB Neck ROM: full    Dental No notable dental hx. (+) Teeth Intact and Dental Advisory Given   Pulmonary neg pulmonary ROS,  breath sounds clear to auscultation  Pulmonary exam normal       Cardiovascular Exercise Tolerance: Good negative cardio ROS  Rhythm:regular Rate:Normal     Neuro/Psych negative neurological ROS  negative psych ROS   GI/Hepatic negative GI ROS, Neg liver ROS, GERD-  Medicated and Controlled,  Endo/Other  negative endocrine ROS  Renal/GU negative Renal ROS  negative genitourinary   Musculoskeletal   Abdominal   Peds  Hematology negative hematology ROS (+)   Anesthesia Other Findings   Reproductive/Obstetrics negative OB ROS                          Anesthesia Physical Anesthesia Plan  ASA: I  Anesthesia Plan: General   Post-op Pain Management:    Induction:   Airway Management Planned:   Additional Equipment:   Intra-op Plan:   Post-operative Plan:   Informed Consent: I have reviewed the patients History and Physical, chart, labs and discussed the procedure including the risks, benefits and alternatives for the proposed anesthesia with the patient or authorized representative who has indicated his/her understanding and acceptance.   Dental Advisory Given  Plan Discussed with: CRNA and Surgeon  Anesthesia Plan Comments:        Anesthesia Quick Evaluation

## 2012-02-26 NOTE — Anesthesia Postprocedure Evaluation (Signed)
  Anesthesia Post-op Note  Patient: Gregory George  Procedure(s) Performed: Procedure(s) (LRB): HYDROCELECTOMY ADULT (Left)  Patient Location: PACU  Anesthesia Type: General  Level of Consciousness: awake and alert   Airway and Oxygen Therapy: Patient Spontanous Breathing  Post-op Pain: mild  Post-op Assessment: Post-op Vital signs reviewed, Patient's Cardiovascular Status Stable, Respiratory Function Stable, Patent Airway and No signs of Nausea or vomiting  Post-op Vital Signs: stable  Complications: No apparent anesthesia complications

## 2012-02-27 ENCOUNTER — Encounter (HOSPITAL_BASED_OUTPATIENT_CLINIC_OR_DEPARTMENT_OTHER): Payer: Self-pay | Admitting: Urology

## 2012-07-05 ENCOUNTER — Encounter: Payer: Self-pay | Admitting: Internal Medicine

## 2012-07-09 ENCOUNTER — Ambulatory Visit (INDEPENDENT_AMBULATORY_CARE_PROVIDER_SITE_OTHER): Payer: 59 | Admitting: Gastroenterology

## 2012-07-09 ENCOUNTER — Encounter: Payer: Self-pay | Admitting: Gastroenterology

## 2012-07-09 VITALS — BP 132/71 | HR 63 | Temp 97.4°F | Ht 72.0 in | Wt 176.6 lb

## 2012-07-09 DIAGNOSIS — K589 Irritable bowel syndrome without diarrhea: Secondary | ICD-10-CM

## 2012-07-09 DIAGNOSIS — K219 Gastro-esophageal reflux disease without esophagitis: Secondary | ICD-10-CM

## 2012-07-09 MED ORDER — DEXLANSOPRAZOLE 60 MG PO CPDR: 60.0000 mg | DELAYED_RELEASE_CAPSULE | Freq: Every day | ORAL | Status: DC

## 2012-07-09 NOTE — Progress Notes (Signed)
Primary Care Physician:  Cassell Smiles., MD Primary Gastroenterologist:  Dr. Jena Gauss   Chief Complaint  Patient presents with  . Gastrophageal Reflux  . Gas    HPI:   Mr. Gregory George is a pleasant 52 year old male last seen in our office in 2009. Last EGD in very remote past with reflux esophagitis, last colonoscopy in 2008 overall benign. Due for routine screening in 2018. Returns with worsening GERD. Aciphex has worked well in past. Last year noted more indigestion, more gas. Works 7 days a week, hard to get an appointment. Ran out of Aciphex, started taking Zantac OTC. Zantac not helping. Previously tried Prilosec, Prevacid. Not tried Protonix. Within last few weeks, large amounts of gas, getting painful. Feeling pressure. Last 2-3 days, noted upset stomach with need to run to bathroom, violent diarrhea. Hasn't had one since Sunday. No BM since Sunday. Will go 2-3 days without a BM. Eats large amounts of fiber. Hx of IBS since his 63s. Always seems like during the spring goes through 2-3 week of constipation, then resolves. Constipation started around first of March. Always seems to start around President's Day. Usually during the rest of the year, he is regular. Grumbling now. No rectal bleeding. No melena. No diet or medication changes.  No weight loss or lack of appetite. No NSAIDs.    Past Medical History  Diagnosis Date  . Hydrocele, left   . GERD (gastroesophageal reflux disease)   . Hyperlipidemia   . 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 History of gastric ulcer REMOTE    Past Surgical History  Procedure Laterality Date  . Repair left inguinal hernia  05-15-2005  DR Lovell Sheehan  . Appendectomy  05-04-2004  . Donated kidney    . Nephrectomy living donor  NOV 2003    LEFT KIDNEY  . Hydrocele excision  02/26/2012    Procedure: HYDROCELECTOMY ADULT;  Surgeon: Valetta Fuller, MD;  Location: [facility];  Service: Urology;  Laterality: Left;   1 hour requested for this case  . Colonoscopy   05/28/2006    WJX:BJYN papilla and internal hemorrhoids, otherwise normal rectum.  Diminutive left colon polyps removed , benign, due for screening 2018  . Esophagogastroduodenoscopy  1998    erosive reflux esophagitis    Current Outpatient Prescriptions  Medication Sig Dispense Refill  . Ascorbic Acid (VITAMIN C) 1000 MG tablet Take 1,000 mg by mouth daily.      . fish oil-omega-3 fatty acids 1000 MG capsule Take 2 g by mouth daily.      Marland Kitchen HYDROcodone-acetaminophen (NORCO/VICODIN) 5-325 MG per tablet Take 1-2 tablets by mouth every 6 (six) hours as needed for pain.  20 tablet  0  . ranitidine (ZANTAC) 150 MG tablet Take 150 mg by mouth 2 (two) times daily.      . simethicone (GAS-X) 80 MG chewable tablet Chew 80 mg by mouth every 6 (six) hours as needed for flatulence.      . simvastatin (ZOCOR) 20 MG tablet Take 20 mg by mouth every evening.      . vitamin B-12 (CYANOCOBALAMIN) 1000 MCG tablet Take 1,000 mcg by mouth daily.      Marland Kitchen zolpidem (AMBIEN) 10 MG tablet Take 10 mg by mouth at bedtime as needed.       Marland Kitchen dexlansoprazole (DEXILANT) 60 MG capsule Take 1 capsule (60 mg total) by mouth daily.  30 capsule  3   No current facility-administered medications for this visit.  Allergies as of 07/09/2012 - Review Complete 07/09/2012  Allergen Reaction Noted  . Vytorin (ezetimibe-simvastatin) Nausea And Vomiting and Other (See Comments) 02/23/2012    Family History  Problem Relation Age of Onset  . Colon cancer Neg Hx     History   Social History  . Marital Status: Married    Spouse Name: N/A    Number of Children: N/A  . Years of Education: N/A   Occupational History  . Not on file.   Social History Main Topics  . Smoking status: Never Smoker   . Smokeless tobacco: Never Used  . Alcohol Use: Yes     Comment: rarely  . Drug Use: No  . Sexually Active: Not on file   Other Topics Concern  . Not on file   Social History  Narrative  . No narrative on file    Review of Systems: Gen: Denies any fever, chills, fatigue, weight loss, lack of appetite.  CV: Denies chest pain, heart palpitations, peripheral edema, syncope.  Resp: Denies shortness of breath at rest or with exertion. Denies wheezing or cough.  GI: SEE HPI GU : Denies urinary burning, urinary frequency, urinary hesitancy MS: Denies joint pain, muscle weakness, cramps, or limitation of movement.  Derm: Denies rash, itching, dry skin Psych: Denies depression, anxiety, memory loss, and confusion Heme: Denies bruising, bleeding, and enlarged lymph nodes.  Physical Exam: BP 132/71  Pulse 63  Temp(Src) 97.4 F (36.3 C) (Oral)  Ht 6' (1.829 m)  Wt 176 lb 9.6 oz (80.105 kg)  BMI 23.95 kg/m2 General:   Alert and oriented. Pleasant and cooperative. Well-nourished and well-developed.  Head:  Normocephalic and atraumatic. Eyes:  Without icterus, sclera clear and conjunctiva pink.  Ears:  Normal auditory acuity. Nose:  No deformity, discharge,  or lesions. Mouth:  No deformity or lesions, oral mucosa pink.  Neck:  Supple, without mass or thyromegaly. Lungs:  Clear to auscultation bilaterally. No wheezes, rales, or rhonchi. No distress.  Heart:  S1, S2 present without murmurs appreciated.  Abdomen:  +BS, soft, non-tender and non-distended. No HSM noted. No guarding or rebound. No masses appreciated.  Rectal:  Deferred  Msk:  Symmetrical without gross deformities. Normal posture. Pulses:  Normal pulses noted. Extremities:  Without clubbing or edema. Neurologic:  Alert and  oriented x4;  grossly normal neurologically. Skin:  Intact without significant lesions or rashes. Psych:  Alert and cooperative. Normal mood and affect.

## 2012-07-09 NOTE — Patient Instructions (Addendum)
Please start taking Dexilant once daily. This is for reflux.   You may take Miralax as needed for constipation.  We will see you back in 6-8 weeks to see how your reflux is doing.

## 2012-07-10 ENCOUNTER — Encounter: Payer: Self-pay | Admitting: Gastroenterology

## 2012-07-10 DIAGNOSIS — K219 Gastro-esophageal reflux disease without esophagitis: Secondary | ICD-10-CM | POA: Insufficient documentation

## 2012-07-10 DIAGNOSIS — K589 Irritable bowel syndrome without diarrhea: Secondary | ICD-10-CM | POA: Insufficient documentation

## 2012-07-10 NOTE — Assessment & Plan Note (Signed)
52 year old male with long-standing history of reflux. Presenting today with worsening of GERD; however, he ran out of Aciphex recently. Tried/failed Prilosec, Prevacid, now stating Aciphex not working as well. No alarm symptoms currently. Will trial Dexilant and see him back in 6-8 weeks. Samples and prescription provided. No need for EGD currently.

## 2012-07-10 NOTE — Assessment & Plan Note (Signed)
Chronic hx of IBS per patient. Appears bouts of constipation each spring. He is well-versed in high fiber diet and management of symptoms. Discussed not letting constipation get out of hand; use Miralax daily as needed if any signs of constipation. Next colonoscopy due 2018.

## 2012-07-15 NOTE — Progress Notes (Signed)
CC PCP 

## 2012-08-23 ENCOUNTER — Encounter: Payer: Self-pay | Admitting: Internal Medicine

## 2012-08-23 ENCOUNTER — Ambulatory Visit (INDEPENDENT_AMBULATORY_CARE_PROVIDER_SITE_OTHER): Payer: 59 | Admitting: Internal Medicine

## 2012-08-23 VITALS — BP 115/75 | HR 58 | Temp 97.1°F | Ht 72.0 in | Wt 178.8 lb

## 2012-08-23 DIAGNOSIS — K5900 Constipation, unspecified: Secondary | ICD-10-CM

## 2012-08-23 DIAGNOSIS — K219 Gastro-esophageal reflux disease without esophagitis: Secondary | ICD-10-CM

## 2012-08-23 NOTE — Progress Notes (Signed)
Primary Care Physician:  Cassell Smiles., MD Primary Gastroenterologist:  Dr. Jena Gauss  Pre-Procedure History & Physical: HPI:  Gregory George is a 52 y.o. male here for followup of GERD. Dexilant working very well for control of reflux symptoms. He is watching his diet. Occasional constipation he takes an over-the-counter fiber laxative. Last colonoscopy 2008 she will be due for routine average risk screening 2018. Has yearly Hemoccults which been negative through Dr. Sharyon Medicus office. Prior EGD demonstrated reflux esophagitis but no Barrett's esophagus. Not had any melena, hematochezia or dysphagia.  Past Medical History  Diagnosis Date  . Hydrocele, left   . GERD (gastroesophageal reflux disease)   . Hyperlipidemia   . 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 History of gastric ulcer REMOTE    Past Surgical History  Procedure Laterality Date  . Repair left inguinal hernia  05-15-2005  DR Lovell Sheehan  . Appendectomy  05-04-2004  . Donated kidney    . Nephrectomy living donor  NOV 2003    LEFT KIDNEY  . Hydrocele excision  02/26/2012    Procedure: HYDROCELECTOMY ADULT;  Surgeon: Valetta Fuller, MD;  Location: [facility];  Service: Urology;  Laterality: Left;  1 hour requested for this case  . Colonoscopy   05/28/2006    ZOX:WRUE papilla and internal hemorrhoids, otherwise normal rectum.  Diminutive left colon polyps removed , benign, due for screening 2018  . Esophagogastroduodenoscopy  1998    erosive reflux esophagitis    Prior to Admission medications   Medication Sig Start Date End Date Taking? Authorizing Provider  Ascorbic Acid (VITAMIN C) 1000 MG tablet Take 1,000 mg by mouth daily.   Yes Historical Provider, MD  dexlansoprazole (DEXILANT) 60 MG capsule Take 1 capsule (60 mg total) by mouth daily. 07/09/12  Yes Nira Retort, NP  fish oil-omega-3 fatty acids 1000 MG capsule Take 2 g by mouth daily.   Yes Historical Provider, MD   HYDROcodone-acetaminophen (NORCO/VICODIN) 5-325 MG per tablet Take 1-2 tablets by mouth every 6 (six) hours as needed for pain. 02/26/12  Yes Valetta Fuller, MD  simethicone (GAS-X) 80 MG chewable tablet Chew 80 mg by mouth every 6 (six) hours as needed for flatulence.   Yes Historical Provider, MD  simvastatin (ZOCOR) 20 MG tablet Take 20 mg by mouth every evening.   Yes Historical Provider, MD  vitamin B-12 (CYANOCOBALAMIN) 1000 MCG tablet Take 1,000 mcg by mouth daily.   Yes Historical Provider, MD  zolpidem (AMBIEN) 10 MG tablet Take 10 mg by mouth at bedtime as needed.  06/08/12  Yes Historical Provider, MD  ranitidine (ZANTAC) 150 MG tablet Take 150 mg by mouth 2 (two) times daily.    Historical Provider, MD    Allergies as of 08/23/2012 - Review Complete 08/23/2012  Allergen Reaction Noted  . Vytorin (ezetimibe-simvastatin) Nausea And Vomiting and Other (See Comments) 02/23/2012    Family History  Problem Relation Age of Onset  . Colon cancer Neg Hx     History   Social History  . Marital Status: Married    Spouse Name: N/A    Number of Children: N/A  . Years of Education: N/A   Occupational History  . Not on file.   Social History Main Topics  . Smoking status: Never Smoker   . Smokeless tobacco: Never Used  . Alcohol Use: Yes     Comment: rarely  . Drug Use: No  . Sexually Active: Not on file  Other Topics Concern  . Not on file   Social History Narrative  . No narrative on file    Review of Systems: See HPI, otherwise negative ROS  Physical Exam: BP 115/75  Pulse 58  Temp(Src) 97.1 F (36.2 C)  Ht 6' (1.829 m)  Wt 178 lb 12.8 oz (81.103 kg)  BMI 24.24 kg/m2 General:   Alert,  Well-developed, well-nourished, pleasant and cooperative in NAD Skin:  Intact without significant lesions or rashes. Eyes:  Sclera clear, no icterus.   Conjunctiva pink. Ears:  Normal auditory acuity. Nose:  No deformity, discharge,  or lesions. Mouth:  No deformity or  lesions. Neck:  Supple; no masses or thyromegaly. No significant cervical adenopathy. Lungs:  Clear throughout to auscultation.   No wheezes, crackles, or rhonchi. No acute distress. Heart:  Regular rate and rhythm; no murmurs, clicks, rubs,  or gallops. Abdomen: Non-distended, normal bowel sounds.  Soft and nontender without appreciable mass or hepatosplenomegaly.  Pulses:  Normal pulses noted. Extremities:  Without clubbing or edema.  Impression/Plan:  GERD well controlled on Dexilant. Occasional constipation on a fiber laxative. Doesn't need a colonoscopy until 2018 for screening purposes.  Recommendations: Continue yearly Hemoccult testing. Continued Dexilant. Might try prunes as a natural remedy for constipation. In addition, might get some MiraLax and have it on hand to take 17 g orally once daily as needed for bowel function.  Unless something comes up, we'll plan to see this nausea went back in the office in one year.

## 2012-08-23 NOTE — Patient Instructions (Addendum)
Continue Dexilant 60 mg daily  Uses prunes and/or Miralax as needed for constipation  Colonoscopy in 2018  Office visit in 1 year  Cc PCP

## 2012-08-26 NOTE — Addendum Note (Signed)
Addended by: Ervin Knack A on: 08/26/2012 08:05 AM   Modules accepted: Level of Service

## 2012-11-08 ENCOUNTER — Other Ambulatory Visit: Payer: Self-pay | Admitting: Gastroenterology

## 2013-12-10 ENCOUNTER — Ambulatory Visit (INDEPENDENT_AMBULATORY_CARE_PROVIDER_SITE_OTHER): Payer: 59 | Admitting: Orthopedic Surgery

## 2013-12-10 ENCOUNTER — Ambulatory Visit (INDEPENDENT_AMBULATORY_CARE_PROVIDER_SITE_OTHER): Payer: 59

## 2013-12-10 VITALS — BP 135/85 | Ht 72.0 in | Wt 172.0 lb

## 2013-12-10 DIAGNOSIS — M25512 Pain in left shoulder: Secondary | ICD-10-CM

## 2013-12-10 DIAGNOSIS — M25519 Pain in unspecified shoulder: Secondary | ICD-10-CM

## 2013-12-10 DIAGNOSIS — M719 Bursopathy, unspecified: Secondary | ICD-10-CM

## 2013-12-10 DIAGNOSIS — M67919 Unspecified disorder of synovium and tendon, unspecified shoulder: Secondary | ICD-10-CM | POA: Insufficient documentation

## 2013-12-10 NOTE — Progress Notes (Signed)
Chief Complaint  Patient presents with  . Shoulder Pain    Left shoulder pain, no injury. Xrays today.   BP 135/85  Ht 6' (1.829 m)  Wt 172 lb (78.019 kg)  BMI 23.32 kg/m2   Vital signs are stable as recorded  General appearance is normal, body habitus normal  The patient is alert and oriented x 3  The patient's mood and affect are normal  Gait assessment: Normal  The cardiovascular exam reveals normal pulses and temperature without edema or  swelling.  The lymphatic system is negative for palpable lymph nodes  The sensory exam is normal.  There are no pathologic reflexes.  Balance is normal.   Exam of the right shoulder full range of motion, stability test normal. Grade 5 motor strength rotator cuff skin normal.  Left shoulder  Inspection no specific tenderness but he does have painful for elevation past 90 positive impingement sign. Rotator cuff weakness and empty can sign stability tests were normal skin intact other provocative test Neer and Hawkins positive   A/P Left shoulder impingement syndrome x-rays are negative  Recommend home exercises and injection  Procedure inject subacromial space left shoulder Diagnosis rotator cuff syndrome left shoulder Medication Depo-Medrol 40 mg, 1 cc and lidocaine 1% 3 cc Verbal consent Timeout completed  The injection site was cleaned with alcohol and sprayed with ethyl chloride. From a posterior approach a 20-gauge needle was injected in the subacromial space. The medication went in easily. There were no complications. The wound was covered with a sterile bandage. Appropriate precautions were given.

## 2013-12-10 NOTE — Patient Instructions (Signed)
Exercises for 6 weeks

## 2013-12-11 ENCOUNTER — Ambulatory Visit: Payer: 59 | Admitting: Orthopedic Surgery

## 2013-12-18 ENCOUNTER — Other Ambulatory Visit: Payer: Self-pay | Admitting: Gastroenterology

## 2014-06-30 ENCOUNTER — Other Ambulatory Visit: Payer: Self-pay | Admitting: Gastroenterology

## 2015-07-05 ENCOUNTER — Other Ambulatory Visit: Payer: Self-pay | Admitting: Gastroenterology

## 2015-11-12 ENCOUNTER — Other Ambulatory Visit: Payer: Self-pay | Admitting: Gastroenterology

## 2016-04-18 ENCOUNTER — Telehealth: Payer: Self-pay | Admitting: Internal Medicine

## 2016-04-18 NOTE — Telephone Encounter (Signed)
ON RECALL FOR TCS °

## 2016-04-18 NOTE — Telephone Encounter (Signed)
Letter mailed to pt.  

## 2016-04-20 DIAGNOSIS — G603 Idiopathic progressive neuropathy: Secondary | ICD-10-CM | POA: Diagnosis not present

## 2016-04-20 DIAGNOSIS — M5412 Radiculopathy, cervical region: Secondary | ICD-10-CM | POA: Diagnosis not present

## 2016-10-30 DIAGNOSIS — K4090 Unilateral inguinal hernia, without obstruction or gangrene, not specified as recurrent: Secondary | ICD-10-CM | POA: Diagnosis not present

## 2016-11-02 ENCOUNTER — Ambulatory Visit (INDEPENDENT_AMBULATORY_CARE_PROVIDER_SITE_OTHER): Payer: 59 | Admitting: General Surgery

## 2016-11-02 ENCOUNTER — Encounter: Payer: Self-pay | Admitting: General Surgery

## 2016-11-02 VITALS — BP 132/77 | HR 64 | Temp 98.0°F | Resp 18 | Ht 71.0 in | Wt 178.0 lb

## 2016-11-02 DIAGNOSIS — K4090 Unilateral inguinal hernia, without obstruction or gangrene, not specified as recurrent: Secondary | ICD-10-CM

## 2016-11-02 NOTE — H&P (Signed)
Gregory George; [id number]; 30-Jan-1961   HPI Patient is a 56 year old white male who was referred to my care by Dr. Gerarda Fraction for evaluation and treatment of a right inguinal hernia. The patient recently noticed a bulge in the right groin. It has been progressively getting more uncomfortable. It is made worse with straining. He currently has a pain of 3 out of 10. The bulge disappears when lying down. Past Medical History:  Diagnosis Date  . GERD (gastroesophageal reflux disease)   . History of gastric ulcer REMOTE  . Hydrocele, left   . Hyperlipidemia   . 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 APPENDECTOMY  05-04-2004  . COLONOSCOPY   05/28/2006   ACZ:YSAY papilla and internal hemorrhoids, otherwise normal rectum.  Diminutive left colon polyps removed , benign, due for screening 2018  . DONATED KIDNEY    . ESOPHAGOGASTRODUODENOSCOPY  1998   erosive reflux esophagitis  . HYDROCELE EXCISION  02/26/2012   Procedure: HYDROCELECTOMY ADULT;  Surgeon: Bernestine Amass, MD;  Location: [facility];  Service: Urology;  Laterality: Left;  1 hour requested for this case  . 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 REPAIR LEFT INGUINAL HERNIA  05-15-2005  DR Arnoldo Morale    Family History  Problem Relation Age of Onset  . Colon cancer Neg Hx     Current Outpatient Prescriptions on File Prior to Visit  Medication Sig Dispense Refill  . Ascorbic Acid (VITAMIN C) 1000 MG tablet Take 1,000 mg by mouth daily.    Marland Kitchen DEXILANT 60 MG capsule TAKE (1) CAPSULE BY MOUTH EVERY DAY. 30 capsule 5  . fish oil-omega-3 fatty acids 1000 MG capsule Take 2 g by mouth daily.    Marland Kitchen HYDROcodone-acetaminophen (NORCO/VICODIN) 5-325 MG per tablet Take 1-2 tablets by mouth every 6 (six) hours as needed for pain. 20 tablet 0  . ranitidine (ZANTAC) 150 MG tablet Take 150 mg by mouth 2 (two) times daily.    . simethicone  (GAS-X) 80 MG chewable tablet Chew 80 mg by mouth every 6 (six) hours as needed for flatulence.    . simvastatin (ZOCOR) 20 MG tablet Take 20 mg by mouth every evening.    . vitamin B-12 (CYANOCOBALAMIN) 1000 MCG tablet Take 1,000 mcg by mouth daily.    Marland Kitchen zolpidem (AMBIEN) 10 MG tablet Take 10 mg by mouth at bedtime as needed.      No current facility-administered medications on file prior to visit.     Allergies  Allergen Reactions  . Vytorin [Ezetimibe-Simvastatin] Nausea And Vomiting and Other (See Comments)    SEVERE N/V AND SEVERE MUSCLE CRAMPS    History  Alcohol Use  . Yes    Comment: rarely    History  Smoking Status  . Never Smoker  Smokeless Tobacco  . Never Used    Review of Systems  Constitutional: Negative.   HENT: Negative.   Eyes: Negative.   Respiratory: Negative.   Cardiovascular: Negative.   Gastrointestinal: Positive for heartburn.  Genitourinary: Negative.   Musculoskeletal: Negative.   Neurological: Negative.   Endo/Heme/Allergies: Negative.   Psychiatric/Behavioral: Negative.     Objective   Vitals:   11/02/16 0904  BP: 132/77  Pulse: 64  Resp: 18  Temp: 98 F (36.7 C)    Physical Exam  Constitutional: He is oriented to person, place, and time and well-developed, well-nourished, and in  no distress.  HENT:  Head: Normocephalic and atraumatic.  Neck: Normal range of motion. Neck supple.  Cardiovascular: Normal rate, regular rhythm and normal heart sounds.   No murmur heard. Pulmonary/Chest: Effort normal and breath sounds normal. He has no wheezes. He has no rales.  Abdominal: Soft. Bowel sounds are normal. He exhibits no distension. There is no tenderness.  Easily reducible right inguinal hernia  Genitourinary:  Genitourinary Comments: Genitourinary examination within normal limits.  Neurological: He is alert and oriented to person, place, and time.  Skin: Skin is warm and dry.  Vitals reviewed.  Dr. Nolon Rod notes  reviewed. Assessment  Right inguinal hernia Plan   Patient is scheduled for a right inguinal herniorrhaphy with mesh on 11/06/2016. The risks and benefits of the procedure including bleeding, infection, mesh use, and the possibility of recurrence of the hernia were fully explained to the patient, who gave informed consent.

## 2016-11-02 NOTE — Patient Instructions (Signed)
Gregory George  11/02/2016     @PREFPERIOPPHARMACY @   Your procedure is scheduled on  11/06/2016   Report to [facility] at  615  A.M.  Call this number if you have problems the morning of surgery:  [phone]   Remember:  Do not eat food or drink liquids after midnight.  Take these medicines the morning of surgery with A SIP OF WATER  Dexilant, hydrocodone, zantac.   Do not wear jewelry, make-up or nail polish.  Do not wear lotions, powders, or perfumes, or deoderant.  Do not shave 48 hours prior to surgery.  Men may shave face and neck.  Do not bring valuables to the hospital.  [facility] is not responsible for any belongings or valuables.  Contacts, dentures or bridgework may not be worn into surgery.  Leave your suitcase in the car.  After surgery it may be brought to your room.  For patients admitted to the hospital, discharge time will be determined by your treatment team.  Patients discharged the day of surgery will not be allowed to drive home.   Name and phone number of your driver:   family Special instructions:   None  Please read over the following fact sheets that you were given. Anesthesia Post-op Instructions and Care and Recovery After Surgery       Open Hernia Repair, Adult Open hernia repair is a surgical procedure to fix a hernia. A hernia occurs when an internal organ or tissue pushes out through a weak spot in the abdominal wall muscles. Hernias commonly occur in the groin and around the navel. Most hernias tend to get worse over time. Often, surgery is done to prevent the hernia from becoming bigger, uncomfortable, or an emergency. Emergency surgery may be needed if abdominal contents get stuck in the opening (incarcerated hernia) or the blood supply gets cut off (strangulated hernia). In an open repair, an incision is made in the abdomen to perform the surgery. Tell a health care provider about:  Any allergies you have.  All  medicines you are taking, including vitamins, herbs, eye drops, creams, and over-the-counter medicines.  Any problems you or family members have had with anesthetic medicines.  Any blood or bone disorders you have.  Any surgeries you have had.  Any medical conditions you have, including any recent cold or flu symptoms.  Whether you are pregnant or may be pregnant. What are the risks? Generally, this is a safe procedure. However, problems may occur, including:  Long-lasting (chronic) pain.  Bleeding.  Infection.  Damage to the testicle. This can cause shrinking or swelling.  Damage to the bladder, blood vessels, intestine, or nerves near the hernia.  Trouble passing urine.  Allergic reactions to medicines.  Return of the hernia.  What happens before the procedure? Staying hydrated Follow instructions from your health care provider about hydration, which may include:  Up to 2 hours before the procedure - you may continue to drink clear liquids, such as water, clear fruit juice, black coffee, and plain tea.  Eating and drinking restrictions Follow instructions from your health care provider about eating and drinking, which may include:  8 hours before the procedure - stop eating heavy meals or foods such as meat, fried foods, or fatty foods.  6 hours before the procedure - stop eating light meals or foods, such as toast or cereal.  6 hours before the procedure - stop drinking milk or  drinks that contain milk.  2 hours before the procedure - stop drinking clear liquids.  Medicines  Ask your health care provider about: ? Changing or stopping your regular medicines. This is especially important if you are taking diabetes medicines or blood thinners. ? Taking medicines such as aspirin and ibuprofen. These medicines can thin your blood. Do not take these medicines before your procedure if your health care provider instructs you not to.  You may be given antibiotic  medicine to help prevent infection. General instructions  You may have blood tests or imaging studies.  Ask your health care provider how your surgical site will be marked or identified.  If you smoke, do not smoke for at least 2 weeks before your procedure or for as long as told by your health care provider.  Let your health care provider know if you develop a cold or any infection before your surgery.  Plan to have someone take you home from the hospital or clinic.  If you will be going home right after the procedure, plan to have someone with you for 24 hours. What happens during the procedure?  To reduce your risk of infection: ? Your health care team will wash or sanitize their hands. ? Your skin will be washed with soap. ? Hair may be removed from the surgical area.  An IV tube will be inserted into one of your veins.  You will be given one or more of the following: ? A medicine to help you relax (sedative). ? A medicine to numb the area (local anesthetic). ? A medicine to make you fall asleep (general anesthetic).  Your surgeon will make an incision over the hernia.  The tissues of the hernia will be moved back into place.  The edges of the hernia may be stitched together.  The opening in the abdominal muscles will be closed with stitches (sutures). Or, your surgeon will place a mesh patch made of manmade (synthetic) material over the opening.  The incision will be closed.  A bandage (dressing) may be placed over the incision. The procedure may vary among health care providers and hospitals. What happens after the procedure?  Your blood pressure, heart rate, breathing rate, and blood oxygen level will be monitored until the medicines you were given have worn off.  You may be given medicine for pain.  Do not drive for 24 hours if you received a sedative. This information is not intended to replace advice given to you by your health care provider. Make sure you  discuss any questions you have with your health care provider. Document Released: 09/27/2000 Document Revised: 10/22/2015 Document Reviewed: 09/15/2015 Elsevier Interactive Patient Education  2018 [place], Adult, Care After These instructions give you information about caring for yourself after your procedure. Your doctor may also give you more specific instructions. If you have problems or questions, contact your doctor. Follow these instructions at home: Surgical cut (incision) care   Follow instructions from your doctor about how to take care of your surgical cut area. Make sure you: ? Wash your hands with soap and water before you change your bandage (dressing). If you cannot use soap and water, use hand sanitizer. ? Change your bandage as told by your doctor. ? Leave stitches (sutures), skin glue, or skin tape (adhesive) strips in place. They may need to stay in place for 2 weeks or longer. If tape strips get loose and curl up, you may trim the loose  edges. Do not remove tape strips completely unless your doctor says it is okay.  Check your surgical cut every day for signs of infection. Check for: ? More redness, swelling, or pain. ? More fluid or blood. ? Warmth. ? Pus or a bad smell. Activity  Do not drive or use heavy machinery while taking prescription pain medicine. Do not drive until your doctor says it is okay.  Until your doctor says it is okay: ? Do not lift anything that is heavier than 10 lb (4.5 kg). ? Do not play contact sports.  Return to your normal activities as told by your doctor. Ask your doctor what activities are safe. General instructions  To prevent or treat having a hard time pooping (constipation) while you are taking prescription pain medicine, your doctor may recommend that you: ? Drink enough fluid to keep your pee (urine) clear or pale yellow. ? Take over-the-counter or prescription medicines. ? Eat foods that are high in  fiber, such as fresh fruits and vegetables, whole grains, and beans. ? Limit foods that are high in fat and processed sugars, such as fried and sweet foods.  Take over-the-counter and prescription medicines only as told by your doctor.  Do not take baths, swim, or use a hot tub until your doctor says it is okay.  Keep all follow-up visits as told by your doctor. This is important. Contact a doctor if:  You develop a rash.  You have more redness, swelling, or pain around your surgical cut.  You have more fluid or blood coming from your surgical cut.  Your surgical cut feels warm to the touch.  You have pus or a bad smell coming from your surgical cut.  You have a fever or chills.  You have blood in your poop (stool).  You have not pooped in 2-3 days.  Medicine does not help your pain. Get help right away if:  You have chest pain or you are short of breath.  You feel light-headed.  You feel weak and dizzy (feel faint).  You have very bad pain.  You throw up (vomit) and your pain is worse. This information is not intended to replace advice given to you by your health care provider. Make sure you discuss any questions you have with your health care provider. Document Released: 04/24/2014 Document Revised: 10/22/2015 Document Reviewed: 09/15/2015 Elsevier Interactive Patient Education  2017 [place] Anesthesia, Adult General anesthesia is the use of medicines to make a person "go to sleep" (be unconscious) for a medical procedure. General anesthesia is often recommended when a procedure:  Is long.  Requires you to be still or in an unusual position.  Is major and can cause you to lose blood.  Is impossible to do without general anesthesia.  The medicines used for general anesthesia are called general anesthetics. In addition to making you sleep, the medicines:  Prevent pain.  Control your blood pressure.  Relax your muscles.  Tell a health care  provider about:  Any allergies you have.  All medicines you are taking, including vitamins, herbs, eye drops, creams, and over-the-counter medicines.  Any problems you or family members have had with anesthetic medicines.  Types of anesthetics you have had in the past.  Any bleeding disorders you have.  Any surgeries you have had.  Any medical conditions you have.  Any history of heart or lung conditions, such as heart failure, sleep apnea, or chronic obstructive pulmonary disease (COPD).  Whether you  are pregnant or may be pregnant.  Whether you use tobacco, alcohol, marijuana, or street drugs.  Any history of Armed forces logistics/support/administrative officer.  Any history of depression or anxiety. What are the risks? Generally, this is a safe procedure. However, problems may occur, including:  Allergic reaction to anesthetics.  Lung and heart problems.  Inhaling food or liquids from your stomach into your lungs (aspiration).  Injury to nerves.  Waking up during your procedure and being unable to move (rare).  Extreme agitation or a state of mental confusion (delirium) when you wake up from the anesthetic.  Air in the bloodstream, which can lead to stroke.  These problems are more likely to develop if you are having a major surgery or if you have an advanced medical condition. You can prevent some of these complications by answering all of your health care provider's questions thoroughly and by following all pre-procedure instructions. General anesthesia can cause side effects, including:  Nausea or vomiting  A sore throat from the breathing tube.  Feeling cold or shivery.  Feeling tired, washed out, or achy.  Sleepiness or drowsiness.  Confusion or agitation.  What happens before the procedure? Staying hydrated Follow instructions from your health care provider about hydration, which may include:  Up to 2 hours before the procedure - you may continue to drink clear liquids, such as  water, clear fruit juice, black coffee, and plain tea.  Eating and drinking restrictions Follow instructions from your health care provider about eating and drinking, which may include:  8 hours before the procedure - stop eating heavy meals or foods such as meat, fried foods, or fatty foods.  6 hours before the procedure - stop eating light meals or foods, such as toast or cereal.  6 hours before the procedure - stop drinking milk or drinks that contain milk.  2 hours before the procedure - stop drinking clear liquids.  Medicines  Ask your health care provider about: ? Changing or stopping your regular medicines. This is especially important if you are taking diabetes medicines or blood thinners. ? Taking medicines such as aspirin and ibuprofen. These medicines can thin your blood. Do not take these medicines before your procedure if your health care provider instructs you not to. ? Taking new dietary supplements or medicines. Do not take these during the week before your procedure unless your health care provider approves them.  If you are told to take a medicine or to continue taking a medicine on the day of the procedure, take the medicine with sips of water. General instructions   Ask if you will be going home the same day, the following day, or after a longer hospital stay. ? Plan to have someone take you home. ? Plan to have someone stay with you for the first 24 hours after you leave the hospital or clinic.  For 3-6 weeks before the procedure, try not to use any tobacco products, such as cigarettes, chewing tobacco, and e-cigarettes.  You may brush your teeth on the morning of the procedure, but make sure to spit out the toothpaste. What happens during the procedure?  You will be given anesthetics through a mask and through an IV tube in one of your veins.  You may receive medicine to help you relax (sedative).  As soon as you are asleep, a breathing tube may be used to  help you breathe.  An anesthesia specialist will stay with you throughout the procedure. He or she will help keep you  comfortable and safe by continuing to give you medicines and adjusting the amount of medicine that you get. He or she will also watch your blood pressure, pulse, and oxygen levels to make sure that the anesthetics do not cause any problems.  If a breathing tube was used to help you breathe, it will be removed before you wake up. The procedure may vary among health care providers and hospitals. What happens after the procedure?  You will wake up, often slowly, after the procedure is complete, usually in a recovery area.  Your blood pressure, heart rate, breathing rate, and blood oxygen level will be monitored until the medicines you were given have worn off.  You may be given medicine to help you calm down if you feel anxious or agitated.  If you will be going home the same day, your health care provider may check to make sure you can stand, drink, and urinate.  Your health care providers will treat your pain and side effects before you go home.  Do not drive for 24 hours if you received a sedative.  You may: ? Feel nauseous and vomit. ? Have a sore throat. ? Have mental slowness. ? Feel cold or shivery. ? Feel sleepy. ? Feel tired. ? Feel sore or achy, even in parts of your body where you did not have surgery. This information is not intended to replace advice given to you by your health care provider. Make sure you discuss any questions you have with your health care provider. Document Released: 07/11/2007 Document Revised: 09/14/2015 Document Reviewed: 03/18/2015 Elsevier Interactive Patient Education  2018 [place] Anesthesia, Adult, Care After These instructions provide you with information about caring for yourself after your procedure. Your health care provider may also give you more specific instructions. Your treatment has been planned according  to current medical practices, but problems sometimes occur. Call your health care provider if you have any problems or questions after your procedure. What can I expect after the procedure? After the procedure, it is common to have:  Vomiting.  A sore throat.  Mental slowness.  It is common to feel:  Nauseous.  Cold or shivery.  Sleepy.  Tired.  Sore or achy, even in parts of your body where you did not have surgery.  Follow these instructions at home: For at least 24 hours after the procedure:  Do not: ? Participate in activities where you could fall or become injured. ? Drive. ? Use heavy machinery. ? Drink alcohol. ? Take sleeping pills or medicines that cause drowsiness. ? Make important decisions or sign legal documents. ? Take care of children on your own.  Rest. Eating and drinking  If you vomit, drink water, juice, or soup when you can drink without vomiting.  Drink enough fluid to keep your urine clear or pale yellow.  Make sure you have little or no nausea before eating solid foods.  Follow the diet recommended by your health care provider. General instructions  Have a responsible adult stay with you until you are awake and alert.  Return to your normal activities as told by your health care provider. Ask your health care provider what activities are safe for you.  Take over-the-counter and prescription medicines only as told by your health care provider.  If you smoke, do not smoke without supervision.  Keep all follow-up visits as told by your health care provider. This is important. Contact a health care provider if:  You continue to have nausea  or vomiting at home, and medicines are not helpful.  You cannot drink fluids or start eating again.  You cannot urinate after 8-12 hours.  You develop a skin rash.  You have fever.  You have increasing redness at the site of your procedure. Get help right away if:  You have difficulty  breathing.  You have chest pain.  You have unexpected bleeding.  You feel that you are having a life-threatening or urgent problem. This information is not intended to replace advice given to you by your health care provider. Make sure you discuss any questions you have with your health care provider. Document Released: 07/10/2000 Document Revised: 09/06/2015 Document Reviewed: 03/18/2015 Elsevier Interactive Patient Education  [employer].

## 2016-11-02 NOTE — Progress Notes (Signed)
Gregory George; [id number]; 1960/08/08   HPI Patient is a 56 year old white male who was referred to my care by Dr. Gerarda Fraction for evaluation and treatment of a right inguinal hernia. The patient recently noticed a bulge in the right groin. It has been progressively getting more uncomfortable. It is made worse with straining. He currently has a pain of 3 out of 10. The bulge disappears when lying down. Past Medical History:  Diagnosis Date  . GERD (gastroesophageal reflux disease)   . History of gastric ulcer REMOTE  . Hydrocele, left   . Hyperlipidemia   . 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 APPENDECTOMY  05-04-2004  . COLONOSCOPY   05/28/2006   YBO:FBPZ papilla and internal hemorrhoids, otherwise normal rectum.  Diminutive left colon polyps removed , benign, due for screening 2018  . DONATED KIDNEY    . ESOPHAGOGASTRODUODENOSCOPY  1998   erosive reflux esophagitis  . HYDROCELE EXCISION  02/26/2012   Procedure: HYDROCELECTOMY ADULT;  Surgeon: Bernestine Amass, MD;  Location: [facility];  Service: Urology;  Laterality: Left;  1 hour requested for this case  . 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 REPAIR LEFT INGUINAL HERNIA  05-15-2005  DR Arnoldo Morale    Family History  Problem Relation Age of Onset  . Colon cancer Neg Hx     Current Outpatient Prescriptions on File Prior to Visit  Medication Sig Dispense Refill  . Ascorbic Acid (VITAMIN C) 1000 MG tablet Take 1,000 mg by mouth daily.    Marland Kitchen DEXILANT 60 MG capsule TAKE (1) CAPSULE BY MOUTH EVERY DAY. 30 capsule 5  . fish oil-omega-3 fatty acids 1000 MG capsule Take 2 g by mouth daily.    Marland Kitchen HYDROcodone-acetaminophen (NORCO/VICODIN) 5-325 MG per tablet Take 1-2 tablets by mouth every 6 (six) hours as needed for pain. 20 tablet 0  . ranitidine (ZANTAC) 150 MG tablet Take 150 mg by mouth 2 (two) times daily.    . simethicone  (GAS-X) 80 MG chewable tablet Chew 80 mg by mouth every 6 (six) hours as needed for flatulence.    . simvastatin (ZOCOR) 20 MG tablet Take 20 mg by mouth every evening.    . vitamin B-12 (CYANOCOBALAMIN) 1000 MCG tablet Take 1,000 mcg by mouth daily.    Marland Kitchen zolpidem (AMBIEN) 10 MG tablet Take 10 mg by mouth at bedtime as needed.      No current facility-administered medications on file prior to visit.     Allergies  Allergen Reactions  . Vytorin [Ezetimibe-Simvastatin] Nausea And Vomiting and Other (See Comments)    SEVERE N/V AND SEVERE MUSCLE CRAMPS    History  Alcohol Use  . Yes    Comment: rarely    History  Smoking Status  . Never Smoker  Smokeless Tobacco  . Never Used    Review of Systems  Constitutional: Negative.   HENT: Negative.   Eyes: Negative.   Respiratory: Negative.   Cardiovascular: Negative.   Gastrointestinal: Positive for heartburn.  Genitourinary: Negative.   Musculoskeletal: Negative.   Neurological: Negative.   Endo/Heme/Allergies: Negative.   Psychiatric/Behavioral: Negative.     Objective   Vitals:   11/02/16 0904  BP: 132/77  Pulse: 64  Resp: 18  Temp: 98 F (36.7 C)    Physical Exam  Constitutional: He is oriented to person, place, and time and well-developed, well-nourished, and in  no distress.  HENT:  Head: Normocephalic and atraumatic.  Neck: Normal range of motion. Neck supple.  Cardiovascular: Normal rate, regular rhythm and normal heart sounds.   No murmur heard. Pulmonary/Chest: Effort normal and breath sounds normal. He has no wheezes. He has no rales.  Abdominal: Soft. Bowel sounds are normal. He exhibits no distension. There is no tenderness.  Easily reducible right inguinal hernia  Genitourinary:  Genitourinary Comments: Genitourinary examination within normal limits.  Neurological: He is alert and oriented to person, place, and time.  Skin: Skin is warm and dry.  Vitals reviewed.  Dr. Nolon Rod notes  reviewed. Assessment  Right inguinal hernia Plan   Patient is scheduled for a right inguinal herniorrhaphy with mesh on 11/06/2016. The risks and benefits of the procedure including bleeding, infection, mesh use, and the possibility of recurrence of the hernia were fully explained to the patient, who gave informed consent.

## 2016-11-02 NOTE — Patient Instructions (Signed)

## 2016-11-03 ENCOUNTER — Other Ambulatory Visit: Payer: Self-pay

## 2016-11-03 ENCOUNTER — Encounter (HOSPITAL_COMMUNITY): Payer: Self-pay

## 2016-11-03 ENCOUNTER — Encounter (HOSPITAL_COMMUNITY)
Admission: RE | Admit: 2016-11-03 | Discharge: 2016-11-03 | Disposition: A | Discharge status: Home / Self Care | Payer: Commercial Managed Care - HMO | Source: Ambulatory Visit | Attending: General Surgery | Admitting: General Surgery

## 2016-11-03 DIAGNOSIS — Z79899 Other long term (current) drug therapy: Secondary | ICD-10-CM | POA: Diagnosis not present

## 2016-11-03 DIAGNOSIS — K4090 Unilateral inguinal hernia, without obstruction or gangrene, not specified as recurrent: Secondary | ICD-10-CM | POA: Diagnosis present

## 2016-11-03 DIAGNOSIS — Z8719 Personal history of other diseases of the digestive system: Secondary | ICD-10-CM | POA: Diagnosis not present

## 2016-11-03 DIAGNOSIS — K219 Gastro-esophageal reflux disease without esophagitis: Secondary | ICD-10-CM | POA: Diagnosis not present

## 2016-11-03 DIAGNOSIS — E785 Hyperlipidemia, unspecified: Secondary | ICD-10-CM | POA: Diagnosis not present

## 2016-11-03 HISTORY — DX: Pure hypercholesterolemia, unspecified: E78.00

## 2016-11-03 HISTORY — DX: Essential (primary) hypertension: I10

## 2016-11-03 LAB — CBC WITH DIFFERENTIAL/PLATELET
BASOS ABS: 0 10*3/uL (ref 0.0–0.1)
BASOS PCT: 0 %
EOS PCT: 3 %
Eosinophils Absolute: 0.2 10*3/uL (ref 0.0–0.7)
HCT: 38.6 % — ABNORMAL LOW (ref 39.0–52.0)
Hemoglobin: 13.5 g/dL (ref 13.0–17.0)
LYMPHS PCT: 30 %
Lymphs Abs: 1.7 10*3/uL (ref 0.7–4.0)
MCH: 31.5 pg (ref 26.0–34.0)
MCHC: 35 g/dL (ref 30.0–36.0)
MCV: 90 fL (ref 78.0–100.0)
Monocytes Absolute: 0.3 10*3/uL (ref 0.1–1.0)
Monocytes Relative: 6 %
NEUTROS ABS: 3.4 10*3/uL (ref 1.7–7.7)
Neutrophils Relative %: 61 %
PLATELETS: 308 10*3/uL (ref 150–400)
RBC: 4.29 MIL/uL (ref 4.22–5.81)
RDW: 11.7 % (ref 11.5–15.5)
WBC: 5.5 10*3/uL (ref 4.0–10.5)

## 2016-11-03 LAB — BASIC METABOLIC PANEL
ANION GAP: 6 (ref 5–15)
BUN: 20 mg/dL (ref 6–20)
CALCIUM: 9.2 mg/dL (ref 8.9–10.3)
CO2: 28 mmol/L (ref 22–32)
Chloride: 101 mmol/L (ref 101–111)
Creatinine, Ser: 1.6 mg/dL — ABNORMAL HIGH (ref 0.61–1.24)
GFR calc Af Amer: 54 mL/min — ABNORMAL LOW (ref >60)
GFR, EST NON AFRICAN AMERICAN: 47 mL/min — AB (ref >60)
Glucose, Bld: 87 mg/dL (ref 65–99)
POTASSIUM: 4.2 mmol/L (ref 3.5–5.1)
SODIUM: 135 mmol/L (ref 135–145)

## 2016-11-06 ENCOUNTER — Ambulatory Visit (HOSPITAL_COMMUNITY): Payer: Commercial Managed Care - HMO | Admitting: Anesthesiology

## 2016-11-06 ENCOUNTER — Encounter (HOSPITAL_COMMUNITY): Payer: Self-pay | Admitting: *Deleted

## 2016-11-06 ENCOUNTER — Ambulatory Visit (HOSPITAL_COMMUNITY)
Admission: RE | Admit: 2016-11-06 | Discharge: 2016-11-06 | Disposition: A | Discharge status: Home / Self Care | Payer: Commercial Managed Care - HMO | Source: Ambulatory Visit | Attending: General Surgery | Admitting: General Surgery

## 2016-11-06 ENCOUNTER — Encounter (HOSPITAL_COMMUNITY)
Admission: RE | Disposition: A | Discharge status: Home / Self Care | Payer: Self-pay | Source: Ambulatory Visit | Attending: General Surgery

## 2016-11-06 DIAGNOSIS — Z79899 Other long term (current) drug therapy: Secondary | ICD-10-CM | POA: Insufficient documentation

## 2016-11-06 DIAGNOSIS — Z8719 Personal history of other diseases of the digestive system: Secondary | ICD-10-CM | POA: Insufficient documentation

## 2016-11-06 DIAGNOSIS — K219 Gastro-esophageal reflux disease without esophagitis: Secondary | ICD-10-CM | POA: Diagnosis not present

## 2016-11-06 DIAGNOSIS — E785 Hyperlipidemia, unspecified: Secondary | ICD-10-CM | POA: Diagnosis not present

## 2016-11-06 DIAGNOSIS — K4090 Unilateral inguinal hernia, without obstruction or gangrene, not specified as recurrent: Secondary | ICD-10-CM | POA: Diagnosis not present

## 2016-11-06 HISTORY — PX: INGUINAL HERNIA REPAIR: SHX194

## 2016-11-06 SURGERY — REPAIR, HERNIA, INGUINAL, ADULT
Anesthesia: General | Laterality: Right

## 2016-11-06 MED ORDER — CHLORHEXIDINE GLUCONATE CLOTH 2 % EX PADS
6.0000 | MEDICATED_PAD | Freq: Once | CUTANEOUS | Status: DC
Start: 2016-11-06 — End: 2016-11-06

## 2016-11-06 MED ORDER — KETOROLAC TROMETHAMINE 30 MG/ML IJ SOLN
30.0000 mg | Freq: Once | INTRAMUSCULAR | Status: AC
  Administered 2016-11-06: 30 mg via INTRAVENOUS

## 2016-11-06 MED ORDER — PROPOFOL 10 MG/ML IV BOLUS
INTRAVENOUS | Status: AC
  Filled 2016-11-06: qty 20

## 2016-11-06 MED ORDER — LIDOCAINE HCL (CARDIAC) 10 MG/ML IV SOLN
INTRAVENOUS | Status: DC | PRN
  Administered 2016-11-06: 40 mg via INTRAVENOUS

## 2016-11-06 MED ORDER — LIDOCAINE HCL (PF) 1 % IJ SOLN
INTRAMUSCULAR | Status: AC
  Filled 2016-11-06: qty 5

## 2016-11-06 MED ORDER — MIDAZOLAM HCL 2 MG/2ML IJ SOLN
INTRAMUSCULAR | Status: AC
  Filled 2016-11-06: qty 2

## 2016-11-06 MED ORDER — PROPOFOL 10 MG/ML IV BOLUS
INTRAVENOUS | Status: DC | PRN
  Administered 2016-11-06: 160 mg via INTRAVENOUS

## 2016-11-06 MED ORDER — SODIUM CHLORIDE 0.9% FLUSH
INTRAVENOUS | Status: AC
  Filled 2016-11-06: qty 20

## 2016-11-06 MED ORDER — BUPIVACAINE LIPOSOME 1.3 % IJ SUSP
INTRAMUSCULAR | Status: AC
  Filled 2016-11-06: qty 20

## 2016-11-06 MED ORDER — ONDANSETRON HCL 4 MG/2ML IJ SOLN
4.0000 mg | Freq: Once | INTRAMUSCULAR | Status: AC
  Administered 2016-11-06: 4 mg via INTRAVENOUS

## 2016-11-06 MED ORDER — MIDAZOLAM HCL 2 MG/2ML IJ SOLN
1.0000 mg | INTRAMUSCULAR | Status: AC
Start: 2016-11-06 — End: 2016-11-06
  Administered 2016-11-06: 2 mg via INTRAVENOUS

## 2016-11-06 MED ORDER — BUPIVACAINE LIPOSOME 1.3 % IJ SUSP
INTRAMUSCULAR | Status: DC | PRN
  Administered 2016-11-06: 20 mL

## 2016-11-06 MED ORDER — FENTANYL CITRATE (PF) 100 MCG/2ML IJ SOLN
25.0000 ug | INTRAMUSCULAR | Status: DC | PRN
  Administered 2016-11-06 (×2): 25 ug via INTRAVENOUS
  Filled 2016-11-06: qty 2

## 2016-11-06 MED ORDER — SODIUM CHLORIDE 0.9 % IR SOLN
Status: DC | PRN
  Administered 2016-11-06: 1000 mL

## 2016-11-06 MED ORDER — LACTATED RINGERS IV SOLN
INTRAVENOUS | Status: DC
  Administered 2016-11-06: 07:00:00 via INTRAVENOUS

## 2016-11-06 MED ORDER — HYDROCODONE-ACETAMINOPHEN 5-325 MG PO TABS: 1.0000 | ORAL_TABLET | ORAL | 0 refills | Status: AC | PRN

## 2016-11-06 MED ORDER — CHLORHEXIDINE GLUCONATE CLOTH 2 % EX PADS: 6.0000 | MEDICATED_PAD | Freq: Once | CUTANEOUS | Status: DC

## 2016-11-06 MED ORDER — KETOROLAC TROMETHAMINE 30 MG/ML IJ SOLN
INTRAMUSCULAR | Status: AC
  Filled 2016-11-06: qty 1

## 2016-11-06 MED ORDER — FENTANYL CITRATE (PF) 250 MCG/5ML IJ SOLN
INTRAMUSCULAR | Status: AC
  Filled 2016-11-06: qty 5

## 2016-11-06 MED ORDER — ONDANSETRON HCL 4 MG/2ML IJ SOLN
INTRAMUSCULAR | Status: AC
  Filled 2016-11-06: qty 2

## 2016-11-06 MED ORDER — CEFAZOLIN SODIUM-DEXTROSE 2-4 GM/100ML-% IV SOLN
2.0000 g | INTRAVENOUS | Status: AC
  Administered 2016-11-06: 2 g via INTRAVENOUS
  Filled 2016-11-06: qty 100

## 2016-11-06 MED ORDER — FENTANYL CITRATE (PF) 100 MCG/2ML IJ SOLN
INTRAMUSCULAR | Status: DC | PRN
Start: 2016-11-06 — End: 2016-11-06
  Administered 2016-11-06: 25 ug via INTRAVENOUS
  Administered 2016-11-06: 50 ug via INTRAVENOUS
  Administered 2016-11-06: 25 ug via INTRAVENOUS

## 2016-11-06 SURGICAL SUPPLY — 37 items
ADH SKN CLS APL DERMABOND .7 (GAUZE/BANDAGES/DRESSINGS) ×1
BAG HAMPER (MISCELLANEOUS) ×3 IMPLANT
CLOTH BEACON ORANGE TIMEOUT ST (SAFETY) ×3 IMPLANT
COVER LIGHT HANDLE STERIS (MISCELLANEOUS) ×6 IMPLANT
DERMABOND ADVANCED (GAUZE/BANDAGES/DRESSINGS) ×2
DERMABOND ADVANCED .7 DNX12 (GAUZE/BANDAGES/DRESSINGS) ×1 IMPLANT
DRAIN PENROSE 18X1/2 LTX STRL (DRAIN) ×3 IMPLANT
ELECT REM PT RETURN 9FT ADLT (ELECTROSURGICAL) ×3
ELECTRODE REM PT RTRN 9FT ADLT (ELECTROSURGICAL) ×1 IMPLANT
GLOVE BIOGEL PI IND STRL 7.0 (GLOVE) ×2 IMPLANT
GLOVE BIOGEL PI INDICATOR 7.0 (GLOVE) ×4
GLOVE SURG SS PI 7.5 STRL IVOR (GLOVE) ×3 IMPLANT
GOWN STRL REUS W/ TWL XL LVL3 (GOWN DISPOSABLE) ×1 IMPLANT
GOWN STRL REUS W/TWL LRG LVL3 (GOWN DISPOSABLE) ×6 IMPLANT
GOWN STRL REUS W/TWL XL LVL3 (GOWN DISPOSABLE) ×3
INST SET MINOR GENERAL (KITS) ×3 IMPLANT
KIT ROOM TURNOVER APOR (KITS) ×3 IMPLANT
MANIFOLD NEPTUNE II (INSTRUMENTS) ×3 IMPLANT
MESH HERNIA 1.6X1.9 PLUG LRG (Mesh General) IMPLANT
MESH HERNIA PLUG LRG (Mesh General) ×2 IMPLANT
NDL HYPO 21X1.5 SAFETY (NEEDLE) ×1 IMPLANT
NEEDLE HYPO 21X1.5 SAFETY (NEEDLE) ×3 IMPLANT
NS IRRIG 1000ML POUR BTL (IV SOLUTION) ×3 IMPLANT
PACK MINOR (CUSTOM PROCEDURE TRAY) ×3 IMPLANT
PAD ARMBOARD 7.5X6 YLW CONV (MISCELLANEOUS) ×3 IMPLANT
SET BASIN LINEN APH (SET/KITS/TRAYS/PACK) ×3 IMPLANT
SUT NOVA NAB GS-22 2 2-0 T-19 (SUTURE) ×8 IMPLANT
SUT PROLENE 2 0 SH 30 (SUTURE) IMPLANT
SUT SILK 3 0 (SUTURE)
SUT SILK 3-0 18XBRD TIE 12 (SUTURE) IMPLANT
SUT VIC AB 2-0 CT1 27 (SUTURE) ×3
SUT VIC AB 2-0 CT1 TAPERPNT 27 (SUTURE) ×1 IMPLANT
SUT VIC AB 3-0 SH 27 (SUTURE) ×3
SUT VIC AB 3-0 SH 27X BRD (SUTURE) ×1 IMPLANT
SUT VIC AB 4-0 PS2 27 (SUTURE) ×3 IMPLANT
SUT VICRYL AB 3 0 TIES (SUTURE) IMPLANT
SYR 20CC LL (SYRINGE) ×3 IMPLANT

## 2016-11-06 NOTE — Transfer of Care (Signed)
Immediate Anesthesia Transfer of Care Note  Patient: Gregory George  Procedure(s) Performed: Procedure(s): HERNIA REPAIR INGUINAL ADULT WITH MESH (Right)  Patient Location: PACU  Anesthesia Type:General  Level of Consciousness: awake and alert   Airway & Oxygen Therapy: Patient Spontanous Breathing  Post-op Assessment: Report given to RN  Post vital signs: Reviewed and stable  Last Vitals:  Vitals:   11/06/16 0715 11/06/16 0720  BP: 120/81 121/80  Pulse:    Resp: (!) 62 (!) 62  Temp:      Last Pain:  Vitals:   11/06/16 0714  TempSrc: Oral      Patients Stated Pain Goal: 6 (16/10/96 0454)  Complications: No apparent anesthesia complications

## 2016-11-06 NOTE — Anesthesia Postprocedure Evaluation (Signed)
Anesthesia Post Note  Patient: Gregory George  Procedure(s) Performed: Procedure(s) (LRB): HERNIA REPAIR INGUINAL ADULT WITH MESH (Right)  Patient location during evaluation: PACU Anesthesia Type: General Level of consciousness: awake and alert and oriented Pain management: pain level controlled Vital Signs Assessment: post-procedure vital signs reviewed and stable Respiratory status: spontaneous breathing Cardiovascular status: blood pressure returned to baseline Postop Assessment: no signs of nausea or vomiting Anesthetic complications: no     Last Vitals:  Vitals:   11/06/16 0908 11/06/16 0916  BP:  123/69  Pulse: (!) 53 (!) 55  Resp: 11 12  Temp:  36.7 C    Last Pain:  Vitals:   11/06/16 0916  TempSrc: Oral  PainSc: 5                  Daymeon Fischman

## 2016-11-06 NOTE — Discharge Instructions (Signed)
Open Hernia Repair, Adult, Care After °This sheet gives you information about how to care for yourself after your procedure. Your health care provider may also give you more specific instructions. If you have problems or questions, contact your health care provider. °What can I expect after the procedure? °After the procedure, it is common to have: °· Mild discomfort. °· Slight bruising. °· Minor swelling. °· Pain in the abdomen. ° °Follow these instructions at home: °Incision care ° °· Follow instructions from your health care provider about how to take care of your incision area. Make sure you: °? Wash your hands with soap and water before you change your bandage (dressing). If soap and water are not available, use hand sanitizer. °? Change your dressing as told by your health care provider. °? Leave stitches (sutures), skin glue, or adhesive strips in place. These skin closures may need to stay in place for 2 weeks or longer. If adhesive strip edges start to loosen and curl up, you may trim the loose edges. Do not remove adhesive strips completely unless your health care provider tells you to do that. °· Check your incision area every day for signs of infection. Check for: °? More redness, swelling, or pain. °? More fluid or blood. °? Warmth. °? Pus or a bad smell. °Activity °· Do not drive or use heavy machinery while taking prescription pain medicine. Do not drive until your health care provider approves. °· Until your health care provider approves: °? Do not lift anything that is heavier than 10 lb (4.5 kg). °? Do not play contact sports. °· Return to your normal activities as told by your health care provider. Ask your health care provider what activities are safe. °General instructions °· To prevent or treat constipation while you are taking prescription pain medicine, your health care provider may recommend that you: °? Drink enough fluid to keep your urine clear or pale yellow. °? Take over-the-counter or  prescription medicines. °? Eat foods that are high in fiber, such as fresh fruits and vegetables, whole grains, and beans. °? Limit foods that are high in fat and processed sugars, such as fried and sweet foods. °· Take over-the-counter and prescription medicines only as told by your health care provider. °· Do not take tub baths or go swimming until your health care provider approves. °· Keep all follow-up visits as told by your health care provider. This is important. °Contact a health care provider if: °· You develop a rash. °· You have more redness, swelling, or pain around your incision. °· You have more fluid or blood coming from your incision. °· Your incision feels warm to the touch. °· You have pus or a bad smell coming from your incision. °· You have a fever or chills. °· You have blood in your stool (feces). °· You have not had a bowel movement in 2-3 days. °· Your pain is not controlled with medicine. °Get help right away if: °· You have chest pain or shortness of breath. °· You feel light-headed or feel faint. °· You have severe pain. °· You vomit and your pain is worse. °This information is not intended to replace advice given to you by your health care provider. Make sure you discuss any questions you have with your health care provider. °Document Released: 10/21/2004 Document Revised: 10/22/2015 Document Reviewed: 09/15/2015 °Elsevier Interactive Patient Education © 2017 [employer] ° °

## 2016-11-06 NOTE — Anesthesia Preprocedure Evaluation (Signed)
Anesthesia Evaluation  Patient identified by MRN, date of birth, ID band Patient awake    Reviewed: Allergy & Precautions, NPO status , Patient's Chart, lab work & pertinent test results  Airway Mallampati: I  TM Distance: >3 FB Neck ROM: Full    Dental  (+) Teeth Intact   Pulmonary neg pulmonary ROS,    breath sounds clear to auscultation       Cardiovascular hypertension, Pt. on medications  Rhythm:Regular Rate:Normal     Neuro/Psych negative neurological ROS  negative psych ROS   GI/Hepatic Neg liver ROS, GERD  Controlled and Medicated,  Endo/Other  negative endocrine ROS  Renal/GU Donor - solitary kidney, cr=1.60     Musculoskeletal   Abdominal   Peds  Hematology negative hematology ROS (+)   Anesthesia Other Findings   Reproductive/Obstetrics                             Anesthesia Physical Anesthesia Plan  ASA: II  Anesthesia Plan: General   Post-op Pain Management:    Induction: Intravenous  PONV Risk Score and Plan:   Airway Management Planned: LMA  Additional Equipment:   Intra-op Plan:   Post-operative Plan: Extubation in OR  Informed Consent: I have reviewed the patients History and Physical, chart, labs and discussed the procedure including the risks, benefits and alternatives for the proposed anesthesia with the patient or authorized representative who has indicated his/her understanding and acceptance.     Plan Discussed with:   Anesthesia Plan Comments:         Anesthesia Quick Evaluation

## 2016-11-06 NOTE — Interval H&P Note (Signed)
History and Physical Interval Note:  11/06/2016 7:14 AM  Gregory George  has presented today for surgery, with the diagnosis of right inguinal hernia  The various methods of treatment have been discussed with the patient and family. After consideration of risks, benefits and other options for treatment, the patient has consented to  Procedure(s): HERNIA REPAIR INGUINAL ADULT WITH MESH (Right) as a surgical intervention .  The patient's history has been reviewed, patient examined, no change in status, stable for surgery.  I have reviewed the patient's chart and labs.  Questions were answered to the patient's satisfaction.     Aviva Signs

## 2016-11-06 NOTE — Op Note (Signed)
Patient:  Gregory George  DOB:  01-22-1961  MRN:  [id number]   Preop Diagnosis:  Right inguinal hernia  Postop Diagnosis:  Same  Procedure:  Right inguinal herniorrhaphy with mesh  Surgeon:  Aviva Signs, M.D.  Anes:  Gen.  Indications:  Patient is a 56 year old white male who presents with a symptomatic right inguinal hernia. The risks and benefits of the procedure including bleeding, infection, mesh use, and the possibility of recurrence of the hernia were fully explained to the patient, who gave informed consent.  Procedure note:  The patient was placed in the supine position. After general anesthesia was administered, the right groin region was prepped and draped using the usual sterile technique with Techni-Care. Surgical site confirmation was performed.  An incision was made in the right groin region down to the external oblique aponeuroses. The aponeuroses was incised to the external ring. A Penrose drain was placed around spermatic cord. The vase deferens was noted within the spermatic cord. The ilioinguinal nerve was identified and retracted. Early from the operative field. The patient had a direct hernia. This was incised at its base and inverted. A large hard PerFix plug was then inserted and secured circumferentially to the transversalis fascia using 2-0 Novafil interrupted sutures. An onlay patch was then placed along the floor of the inguinal canal and secured superiorly to the conjoined tendon and inferiorly to the shelving edge of Poupart's ligament using 2-0 Novafil interrupted sutures. The internal ring was re-created using a 2-0 Novafil interrupted suture. The external oblique aponeuroses was reapproximated using a 2-0 Vicryl running suture. The subcutaneous layer was reapproximated using a 3-0 Vicryl interrupted suture. X Pearl was instilled into the surrounding wound. The skin was closed using a 4-0 Vicryl subcuticular suture. Dermabond was applied.  All tape and needle  counts were correct at the end of the procedure. The patient was awakened and transferred to PACU in stable condition.  Complications:  None  EBL:  Minimal  Specimen:  None

## 2016-11-06 NOTE — Anesthesia Procedure Notes (Signed)
Procedure Name: LMA Insertion Date/Time: 11/06/2016 7:37 AM Performed by: Tressie Stalker E Pre-anesthesia Checklist: Patient identified, Patient being monitored, Emergency Drugs available, Timeout performed and Suction available Patient Re-evaluated:Patient Re-evaluated prior to induction Oxygen Delivery Method: Circle System Utilized Preoxygenation: Pre-oxygenation with 100% oxygen Induction Type: IV induction Ventilation: Mask ventilation without difficulty LMA: LMA inserted LMA Size: 4.0 Number of attempts: 1 Placement Confirmation: positive ETCO2 and breath sounds checked- equal and bilateral

## 2016-11-07 ENCOUNTER — Encounter (HOSPITAL_COMMUNITY): Payer: Self-pay | Admitting: General Surgery

## 2016-11-14 ENCOUNTER — Ambulatory Visit (INDEPENDENT_AMBULATORY_CARE_PROVIDER_SITE_OTHER): Payer: Self-pay | Admitting: General Surgery

## 2016-11-14 ENCOUNTER — Encounter: Payer: Self-pay | Admitting: General Surgery

## 2016-11-14 ENCOUNTER — Ambulatory Visit: Payer: 59 | Admitting: General Surgery

## 2016-11-14 VITALS — BP 135/85 | HR 63 | Temp 97.7°F | Resp 18 | Ht 71.0 in | Wt 177.0 lb

## 2016-11-14 DIAGNOSIS — Z09 Encounter for follow-up examination after completed treatment for conditions other than malignant neoplasm: Secondary | ICD-10-CM

## 2016-11-14 NOTE — Progress Notes (Signed)
Subjective:     Gregory George  Status post right inguinal herniorrhaphy with mesh. Does have mild incisional pain with movement. Objective:    BP 135/85   Pulse 63   Temp 97.7 F (36.5 C)   Resp 18   Ht 5\' 11"  (1.803 m)   Wt 177 lb (80.3 kg)   BMI 24.69 kg/m   General:  alert, cooperative and no distress  Right inguinal incision healing well. No significant ecchymosis noted. Minimal swelling.     Assessment:    Doing well postoperatively.    Plan:   Gradually increase activity, but still no heavy lifting or prolonged standing. We will see him back in the office on 12/14/2016.

## 2016-11-17 ENCOUNTER — Emergency Department (HOSPITAL_COMMUNITY)
Admission: EM | Admit: 2016-11-17 | Discharge: 2016-11-17 | Disposition: A | Discharge status: Home / Self Care | Payer: Commercial Managed Care - HMO | Attending: Emergency Medicine | Admitting: Emergency Medicine

## 2016-11-17 ENCOUNTER — Encounter (HOSPITAL_COMMUNITY): Payer: Self-pay | Admitting: Emergency Medicine

## 2016-11-17 ENCOUNTER — Emergency Department (HOSPITAL_COMMUNITY): Payer: Commercial Managed Care - HMO

## 2016-11-17 DIAGNOSIS — Z79899 Other long term (current) drug therapy: Secondary | ICD-10-CM | POA: Insufficient documentation

## 2016-11-17 DIAGNOSIS — M791 Myalgia: Secondary | ICD-10-CM | POA: Diagnosis not present

## 2016-11-17 DIAGNOSIS — S4992XA Unspecified injury of left shoulder and upper arm, initial encounter: Secondary | ICD-10-CM | POA: Diagnosis not present

## 2016-11-17 DIAGNOSIS — M542 Cervicalgia: Secondary | ICD-10-CM | POA: Diagnosis not present

## 2016-11-17 DIAGNOSIS — M25512 Pain in left shoulder: Secondary | ICD-10-CM | POA: Insufficient documentation

## 2016-11-17 DIAGNOSIS — I10 Essential (primary) hypertension: Secondary | ICD-10-CM | POA: Insufficient documentation

## 2016-11-17 DIAGNOSIS — M7918 Myalgia, other site: Secondary | ICD-10-CM

## 2016-11-17 MED ORDER — METHOCARBAMOL 500 MG PO TABS: 500.0000 mg | ORAL_TABLET | Freq: Four times a day (QID) | ORAL | 0 refills | Status: AC
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
## 2016-11-17 NOTE — ED Notes (Signed)
Pt made aware to return if symptoms worsen or if any life threatening symptoms occur.   
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
## 2016-11-17 NOTE — ED Notes (Signed)
Ice given to pt. 

## 2016-12-14 ENCOUNTER — Encounter: Payer: Self-pay | Admitting: General Surgery

## 2016-12-14 ENCOUNTER — Ambulatory Visit (INDEPENDENT_AMBULATORY_CARE_PROVIDER_SITE_OTHER): Payer: Self-pay | Admitting: General Surgery

## 2016-12-14 VITALS — BP 120/73 | HR 56 | Temp 97.1°F | Resp 18 | Ht 71.0 in | Wt 178.0 lb

## 2016-12-14 DIAGNOSIS — Z09 Encounter for follow-up examination after completed treatment for conditions other than malignant neoplasm: Secondary | ICD-10-CM

## 2016-12-14 NOTE — Progress Notes (Signed)
Subjective:     Gregory George  Status post right inguinal herniorrhaphy with mesh. Doing well. Objective:    BP 120/73   Pulse (!) 56   Temp (!) 97.1 F (36.2 C)   Resp 18   Ht 5\' 11"  (1.803 m)   Wt 178 lb (80.7 kg)   BMI 24.83 kg/m   General:  alert, cooperative and no distress  Incision healing well. No seroma present.     Assessment:    Doing well postoperatively.    Plan:   May return to work without restrictions on 12/19/2016. Follow-up here as needed.

## 2017-01-02 DIAGNOSIS — I10 Essential (primary) hypertension: Secondary | ICD-10-CM | POA: Diagnosis not present

## 2017-01-02 DIAGNOSIS — D631 Anemia in chronic kidney disease: Secondary | ICD-10-CM | POA: Diagnosis not present

## 2017-01-02 DIAGNOSIS — N2581 Secondary hyperparathyroidism of renal origin: Secondary | ICD-10-CM | POA: Diagnosis not present

## 2017-01-02 DIAGNOSIS — N183 Chronic kidney disease, stage 3 (moderate): Secondary | ICD-10-CM | POA: Diagnosis not present

## 2017-01-03 ENCOUNTER — Other Ambulatory Visit (HOSPITAL_COMMUNITY): Payer: Self-pay | Admitting: Nephrology

## 2017-01-03 DIAGNOSIS — N183 Chronic kidney disease, stage 3 unspecified: Secondary | ICD-10-CM

## 2017-01-08 ENCOUNTER — Ambulatory Visit (HOSPITAL_COMMUNITY)
Admission: RE | Admit: 2017-01-08 | Discharge: 2017-01-08 | Disposition: A | Discharge status: Home / Self Care | Payer: 59 | Source: Ambulatory Visit | Attending: Nephrology | Admitting: Nephrology

## 2017-01-08 DIAGNOSIS — Z905 Acquired absence of kidney: Secondary | ICD-10-CM | POA: Insufficient documentation

## 2017-01-08 DIAGNOSIS — N183 Chronic kidney disease, stage 3 unspecified: Secondary | ICD-10-CM

## 2017-02-12 DIAGNOSIS — Z0001 Encounter for general adult medical examination with abnormal findings: Secondary | ICD-10-CM | POA: Diagnosis not present

## 2017-02-12 DIAGNOSIS — N183 Chronic kidney disease, stage 3 (moderate): Secondary | ICD-10-CM | POA: Diagnosis not present

## 2017-02-12 DIAGNOSIS — K219 Gastro-esophageal reflux disease without esophagitis: Secondary | ICD-10-CM | POA: Diagnosis not present

## 2017-02-12 DIAGNOSIS — Z23 Encounter for immunization: Secondary | ICD-10-CM | POA: Diagnosis not present

## 2017-02-12 DIAGNOSIS — Z1389 Encounter for screening for other disorder: Secondary | ICD-10-CM | POA: Diagnosis not present

## 2017-02-20 ENCOUNTER — Encounter: Payer: Self-pay | Admitting: General Practice

## 2017-03-28 DIAGNOSIS — L0889 Other specified local infections of the skin and subcutaneous tissue: Secondary | ICD-10-CM | POA: Diagnosis not present

## 2017-03-28 DIAGNOSIS — K219 Gastro-esophageal reflux disease without esophagitis: Secondary | ICD-10-CM | POA: Diagnosis not present

## 2017-03-30 ENCOUNTER — Encounter: Payer: Self-pay | Admitting: Internal Medicine

## 2017-04-05 ENCOUNTER — Encounter: Payer: Self-pay | Admitting: General Surgery

## 2017-04-05 ENCOUNTER — Ambulatory Visit: Payer: 59 | Admitting: General Surgery

## 2017-04-05 VITALS — BP 117/68 | HR 61 | Temp 97.8°F | Ht 71.0 in | Wt 176.0 lb

## 2017-04-05 DIAGNOSIS — L723 Sebaceous cyst: Secondary | ICD-10-CM

## 2017-04-05 NOTE — Progress Notes (Signed)
Subjective:     Gregory George  Here for follow-up of the sebaceous cyst on his back.  It has been present for some time, but started draining recently.  He was seen by his primary care physician and was started on antibiotic.  He has been able to get chunks of white out of it.  No fever or chills have been noted. Objective:    BP 117/68   Pulse 61   Temp 97.8 F (36.6 C)   Ht 5\' 11"  (1.803 m)   Wt 176 lb (79.8 kg)   BMI 24.55 kg/m   General:  alert, cooperative and no distress  Back: 2 cm area of erythema with opening present.  Sebum expressed.  Cleaned with peroxide and Q-tip.  Remaining wall removed.     Assessment:    Infected sebaceous cyst, resolving    Plan:  Keep wound clean and dry.  May apply triple antibiotic ointment as needed.  Should resolve on its own.  Follow-up as needed.  Finish antibiotic course.

## 2017-04-05 NOTE — Patient Instructions (Signed)
Epidermal Cyst An epidermal cyst is a small, painless lump under your skin. It may be called an epidermal inclusion cyst or an infundibular cyst. The cyst contains a grayish-white, bad-smelling substance (keratin). It is important not to pop epidermal cysts yourself. These cysts are usually harmless (benign), but they can get infected. Symptoms of infection may include:  Redness.  Inflammation.  Tenderness.  Warmth.  Fever.  A grayish-white, bad-smelling substance draining from the cyst.  Pus draining from the cyst.  Follow these instructions at home:  Take over-the-counter and prescription medicines only as told by your doctor.  If you were prescribed an antibiotic, use it as told by your doctor. Do not stop using the antibiotic even if you start to feel better.  Keep the area around your cyst clean and dry.  Wear loose, dry clothing.  Do not try to pop your cyst.  Avoid touching your cyst.  Check your cyst every day for signs of infection.  Keep all follow-up visits as told by your doctor. This is important. How is this prevented?  Wear clean, dry, clothing.  Avoid wearing tight clothing.  Keep your skin clean and dry. Shower or take baths every day.  Wash your body with a benzoyl peroxide wash when you shower or bathe. Contact a health care provider if:  Your cyst has symptoms of infection.  Your condition is not improving or is getting worse.  You have a cyst that looks different from other cysts you have had.  You have a fever. Get help right away if:  Redness spreads from the cyst into the surrounding area. This information is not intended to replace advice given to you by your health care provider. Make sure you discuss any questions you have with your health care provider. Document Released: 05/11/2004 Document Revised: 12/01/2015 Document Reviewed: 02/03/2015 Elsevier Interactive Patient Education  2018 Elsevier Inc.  

## 2017-05-16 ENCOUNTER — Ambulatory Visit (INDEPENDENT_AMBULATORY_CARE_PROVIDER_SITE_OTHER): Payer: Self-pay

## 2017-05-16 ENCOUNTER — Encounter: Payer: Self-pay | Admitting: *Deleted

## 2017-05-16 DIAGNOSIS — Z1211 Encounter for screening for malignant neoplasm of colon: Secondary | ICD-10-CM

## 2017-05-16 MED ORDER — PEG 3350-KCL-NA BICARB-NACL 420 G PO SOLR: 4000.0000 mL | ORAL | 0 refills | Status: DC

## 2017-05-16 NOTE — Progress Notes (Addendum)
Gastroenterology Pre-Procedure Review  Request Date:05/16/17 Requesting Physician: Dr.Fusco     PATIENT REVIEW QUESTIONS: The patient responded to the following health history questions as indicated:   Pt stated he only takes hydrocodone 3-4 times a year, when he gets a migraine type headache.   1. Diabetes Melitis: no 2. Joint replacements in the past 12 months: no 3. Major health problems in the past 3 months: no 4. Has an artificial valve or MVP: no 5. Has a defibrillator: no 6. Has been advised in past to take antibiotics in advance of a procedure like teeth cleaning: no 7. Family history of colon cancer: no  8. Alcohol Use: no 9. History of sleep apnea: no  10. History of coronary artery or other vascular stents placed within the last 12 months: no 11. History of any prior anesthesia complications: no    MEDICATIONS & ALLERGIES:    Patient reports the following regarding taking any blood thinners:   Plavix? no Aspirin? no Coumadin? no Brilinta? no Xarelto? no Eliquis? no Pradaxa? no Savaysa? no Effient? no  Patient confirms/reports the following medications:  Current Outpatient Medications  Medication Sig Dispense Refill  . acetaminophen (TYLENOL) 500 MG tablet Take 1,000 mg by mouth daily as needed for moderate pain or headache.    . B Complex-C (B-COMPLEX WITH VITAMIN C) tablet Take 1 tablet by mouth daily.    . Carboxymethylcellul-Glycerin (LUBRICATING EYE DROPS OP) Apply 1 drop to eye daily as needed (dry eyes).    . diphenhydrAMINE (BENADRYL) 25 MG tablet Take 25 mg by mouth daily as needed for allergies.    . fish oil-omega-3 fatty acids 1000 MG capsule Take 1 g by mouth daily.     Marland Kitchen HYDROcodone-acetaminophen (NORCO/VICODIN) 5-325 MG tablet Take 1-2 tablets by mouth every 4 (four) hours as needed. 40 tablet 0  . olmesartan (BENICAR) 5 MG tablet Take 5 mg by mouth daily.    . pantoprazole (PROTONIX) 40 MG tablet Take 40 mg by mouth daily.    Marland Kitchen POTASSIUM PO Take 1  tablet by mouth daily.    . simethicone (GAS-X) 80 MG chewable tablet Chew 80 mg by mouth every 6 (six) hours as needed for flatulence.    . simvastatin (ZOCOR) 40 MG tablet Take 40 mg by mouth every evening.    . vitamin C (ASCORBIC ACID) 500 MG tablet Take 500 mg by mouth daily.    Marland Kitchen zolpidem (AMBIEN) 10 MG tablet Take 10 mg by mouth at bedtime as needed for sleep.      No current facility-administered medications for this visit.     Patient confirms/reports the following allergies:  Allergies  Allergen Reactions  . Vytorin [Ezetimibe-Simvastatin] Nausea And Vomiting and Other (See Comments)    SEVERE N/V AND SEVERE MUSCLE CRAMPS    No orders of the defined types were placed in this encounter.   AUTHORIZATION INFORMATION Primary Insurance: [employer],  [place] #: [id number] Pre-Cert / Josem Kaufmann required: yes Pre-Cert / Auth #: [id number]   SCHEDULE INFORMATION: Procedure has been scheduled as follows:  Date: 06/27/17, Time: 8:30 Location: [facility] Dr.Rourk  This Gastroenterology Pre-Precedure Review Form is being routed to the following provider(s): Roseanne Kaufman NP

## 2017-05-16 NOTE — Patient Instructions (Signed)
Gregory George   Jan 12, 1961 MRN: [id number]    Procedure Date: 06/27/17 Time to register: 7:30 Place to register: [facility] Procedure Time: 8:30 Scheduled provider: [place] WITH TRI-LYTE SPLIT PREP  Please notify us immediately if you are diabetic, take iron supplements, or if you are on Coumadin or any other blood thinners.    You will need to purchase 1 fleet enema and 1 box of Bisacodyl '5mg'$  tablets.   2 DAYS BEFORE PROCEDURE:  DATE: 06/25/17  DAY: Monday Begin clear liquid diet AFTER your lunch meal. NO SOLID FOODS after this point.  1 DAY BEFORE PROCEDURE:  DATE: 06/26/17   DAY: Tuesday Continue clear liquids the entire day - NO SOLID FOOD.    At 2:00 pm:  Take 2 Bisacodyl tablets.   At 4:00pm:  Start drinking your solution. Make sure you mix well per instructions on the bottle. Try to drink 1 (one) 8 ounce glass every 10-15 minutes until you have consumed HALF the jug. You should complete by 6:00pm.You must keep the left over solution refrigerated until completed next day.  Continue clear liquids. You must drink plenty of clear liquids to prevent dehyration and kidney failure. Nothing to eat or drink after midnight.  EXCEPTION: If you take medications for your heart, blood pressure or breathing, you may take these medications with a small amount of clear liquid.    DAY OF PROCEDURE:   DATE: 06/27/17   DAY: Wednesday    Five hours before your procedure time @ 3:30am:  Finish remaining amout of bowel prep, drinking 1 (one) 8 ounce glass every 10-15 minutes until complete. You have two hours to consume remaining prep.   Three hours before your procedure time '@5'$ :30am:  Nothing by mouth.   At least one hour before going to the hospital:  Give yourself one Fleet enema. You may take your morning medications with sip of water unless we have instructed otherwise.      Please see below for Dietary Information.  CLEAR LIQUIDS  INCLUDE:  Water Jello (NOT red in color)   Ice Popsicles (NOT red in color)   Tea (sugar ok, no milk/cream) Powdered fruit flavored drinks  Coffee (sugar ok, no milk/cream) Gatorade/ Lemonade/ Kool-Aid  (NOT red in color)   Juice: apple, white grape, white cranberry Soft drinks  Clear bullion, consomme, broth (fat free beef/chicken/vegetable)  Carbonated beverages (any kind)  Strained chicken noodle soup Hard Candy   Remember: Clear liquids are liquids that will allow you to see your fingers on the other side of a clear glass. Be sure liquids are NOT red in color, and not cloudy, but CLEAR.  DO NOT EAT OR DRINK ANY OF THE FOLLOWING:  Dairy products of any kind   Cranberry juice Tomato juice / V8 juice   Grapefruit juice Orange juice     Red grape juice  Do not eat any solid foods, including such foods as: cereal, oatmeal, yogurt, fruits, vegetables, creamed soups, eggs, bread, crackers, pureed foods in a blender, etc.   HELPFUL HINTS FOR DRINKING PREP SOLUTION:   Make sure prep is extremely cold. Mix and refrigerate the the morning of the prep. You may also put in the freezer.   You may try mixing some Crystal Light or Country Time Lemonade if you prefer. Mix in small amounts; add more if necessary.  Try drinking through a straw  Rinse mouth with water or a mouthwash between glasses, to remove after-taste.  Try sipping on a cold beverage /ice/ popsicles between glasses of prep.  Place a piece of sugar-free hard candy in mouth between glasses.  If you become nauseated, try consuming smaller amounts, or stretch out the time between glasses. Stop for 30-60 minutes, then slowly start back drinking.        OTHER INSTRUCTIONS  You will need a responsible adult at least 56 years of age to accompany you and drive you home. This person must remain in the waiting room during your procedure. The hospital will cancel your procedure if you do not have a responsible adult with you.    1. Wear loose fitting clothing that is easily removed. 2. Leave jewelry and other valuables at home.  3. Remove all body piercing jewelry and leave at home. 4. Total time from sign-in until discharge is approximately 2-3 hours. 5. You should go home directly after your procedure and rest. You can resume normal activities the day after your procedure. 6. The day of your procedure you should not:  Drive  Make legal decisions  Operate machinery  Drink alcohol  Return to work   You may call the office (Dept: [phone]) before 5:00pm, or page the doctor on call [phone]) after 5:00pm, for further instructions, if necessary.   Insurance Information YOU WILL NEED TO CHECK WITH YOUR INSURANCE COMPANY FOR THE BENEFITS OF COVERAGE YOU HAVE FOR THIS PROCEDURE.  UNFORTUNATELY, NOT ALL INSURANCE COMPANIES HAVE BENEFITS TO COVER ALL OR PART OF THESE TYPES OF PROCEDURES.  IT IS YOUR RESPONSIBILITY TO CHECK YOUR BENEFITS, HOWEVER, WE WILL BE GLAD TO ASSIST YOU WITH ANY CODES YOUR INSURANCE COMPANY MAY NEED.    PLEASE NOTE THAT MOST INSURANCE COMPANIES WILL NOT COVER A SCREENING COLONOSCOPY FOR PEOPLE UNDER THE AGE OF 50  IF YOU HAVE BCBS INSURANCE, YOU MAY HAVE BENEFITS FOR A SCREENING COLONOSCOPY BUT IF POLYPS ARE FOUND THE DIAGNOSIS WILL CHANGE AND THEN YOU MAY HAVE A DEDUCTIBLE THAT WILL NEED TO BE MET. SO PLEASE MAKE SURE YOU CHECK YOUR BENEFITS FOR A SCREENING COLONOSCOPY AS WELL AS A DIAGNOSTIC COLONOSCOPY.

## 2017-05-18 NOTE — Progress Notes (Signed)
How often does he take Ambien?

## 2017-05-22 NOTE — Progress Notes (Signed)
Tried to call pt- NA- LMOM 

## 2017-05-23 ENCOUNTER — Telehealth: Payer: Self-pay

## 2017-05-23 NOTE — Telephone Encounter (Signed)
Pt is returning Julie's call. Pt can be reached at [phone]

## 2017-05-23 NOTE — Progress Notes (Signed)
Appropriate.

## 2017-05-23 NOTE — Progress Notes (Signed)
Pt said he only takes [place] "once in a blue moon". 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

## 2017-05-23 NOTE — Telephone Encounter (Signed)
Spoke with the pt, see triage sheet.

## 2017-06-19 DIAGNOSIS — M9901 Segmental and somatic dysfunction of cervical region: Secondary | ICD-10-CM | POA: Diagnosis not present

## 2017-06-19 DIAGNOSIS — M5413 Radiculopathy, cervicothoracic region: Secondary | ICD-10-CM | POA: Diagnosis not present

## 2017-06-19 DIAGNOSIS — M9902 Segmental and somatic dysfunction of thoracic region: Secondary | ICD-10-CM | POA: Diagnosis not present

## 2017-06-20 DIAGNOSIS — M9902 Segmental and somatic dysfunction of thoracic region: Secondary | ICD-10-CM | POA: Diagnosis not present

## 2017-06-20 DIAGNOSIS — M9901 Segmental and somatic dysfunction of cervical region: Secondary | ICD-10-CM | POA: Diagnosis not present

## 2017-06-20 DIAGNOSIS — M5413 Radiculopathy, cervicothoracic region: Secondary | ICD-10-CM | POA: Diagnosis not present

## 2017-06-22 DIAGNOSIS — M9902 Segmental and somatic dysfunction of thoracic region: Secondary | ICD-10-CM | POA: Diagnosis not present

## 2017-06-22 DIAGNOSIS — M5413 Radiculopathy, cervicothoracic region: Secondary | ICD-10-CM | POA: Diagnosis not present

## 2017-06-22 DIAGNOSIS — M9901 Segmental and somatic dysfunction of cervical region: Secondary | ICD-10-CM | POA: Diagnosis not present

## 2017-06-26 DIAGNOSIS — M9901 Segmental and somatic dysfunction of cervical region: Secondary | ICD-10-CM | POA: Diagnosis not present

## 2017-06-26 DIAGNOSIS — M9902 Segmental and somatic dysfunction of thoracic region: Secondary | ICD-10-CM | POA: Diagnosis not present

## 2017-06-26 DIAGNOSIS — M5413 Radiculopathy, cervicothoracic region: Secondary | ICD-10-CM | POA: Diagnosis not present

## 2017-06-27 ENCOUNTER — Encounter (HOSPITAL_COMMUNITY): Payer: Self-pay | Admitting: *Deleted

## 2017-06-27 ENCOUNTER — Encounter (HOSPITAL_COMMUNITY)
Admission: RE | Disposition: A | Discharge status: Home / Self Care | Payer: Self-pay | Source: Ambulatory Visit | Attending: Internal Medicine

## 2017-06-27 ENCOUNTER — Ambulatory Visit (HOSPITAL_COMMUNITY)
Admission: RE | Admit: 2017-06-27 | Discharge: 2017-06-27 | Disposition: A | Discharge status: Home / Self Care | Payer: 59 | Source: Ambulatory Visit | Attending: Internal Medicine | Admitting: Internal Medicine

## 2017-06-27 ENCOUNTER — Other Ambulatory Visit: Payer: Self-pay

## 2017-06-27 DIAGNOSIS — D124 Benign neoplasm of descending colon: Secondary | ICD-10-CM | POA: Insufficient documentation

## 2017-06-27 DIAGNOSIS — I10 Essential (primary) hypertension: Secondary | ICD-10-CM | POA: Diagnosis not present

## 2017-06-27 DIAGNOSIS — Z1212 Encounter for screening for malignant neoplasm of rectum: Secondary | ICD-10-CM

## 2017-06-27 DIAGNOSIS — Z1211 Encounter for screening for malignant neoplasm of colon: Secondary | ICD-10-CM | POA: Insufficient documentation

## 2017-06-27 DIAGNOSIS — E7800 Pure hypercholesterolemia, unspecified: Secondary | ICD-10-CM | POA: Diagnosis not present

## 2017-06-27 DIAGNOSIS — Z79899 Other long term (current) drug therapy: Secondary | ICD-10-CM | POA: Insufficient documentation

## 2017-06-27 HISTORY — PX: COLONOSCOPY: SHX5424

## 2017-06-27 SURGERY — COLONOSCOPY
Anesthesia: Moderate Sedation

## 2017-06-27 MED ORDER — MIDAZOLAM HCL 5 MG/5ML IJ SOLN
INTRAMUSCULAR | Status: AC
  Filled 2017-06-27: qty 10

## 2017-06-27 MED ORDER — SODIUM CHLORIDE 0.9 % IV SOLN
INTRAVENOUS | Status: DC
  Administered 2017-06-27: 08:00:00 via INTRAVENOUS

## 2017-06-27 MED ORDER — MEPERIDINE HCL 100 MG/ML IJ SOLN
INTRAMUSCULAR | Status: AC
  Filled 2017-06-27: qty 2

## 2017-06-27 MED ORDER — MIDAZOLAM HCL 5 MG/5ML IJ SOLN
INTRAMUSCULAR | Status: DC | PRN
  Administered 2017-06-27: 2 mg via INTRAVENOUS
  Administered 2017-06-27: 1 mg via INTRAVENOUS
  Administered 2017-06-27: 2 mg via INTRAVENOUS

## 2017-06-27 MED ORDER — MEPERIDINE HCL 100 MG/ML IJ SOLN
INTRAMUSCULAR | Status: DC | PRN
  Administered 2017-06-27: 50 mg
  Administered 2017-06-27: 25 mg via INTRAVENOUS

## 2017-06-27 MED ORDER — ONDANSETRON HCL 4 MG/2ML IJ SOLN
INTRAMUSCULAR | Status: AC
  Filled 2017-06-27: qty 2

## 2017-06-27 MED ORDER — ONDANSETRON HCL 4 MG/2ML IJ SOLN
INTRAMUSCULAR | Status: DC | PRN
  Administered 2017-06-27: 4 mg via INTRAVENOUS

## 2017-06-27 NOTE — H&P (Signed)
@LOGO @   Primary Care Physician:  Redmond School, MD Primary Gastroenterologist:  Dr. Gala Romney  Pre-Procedure History & Physical: HPI:  Gregory George is a 57 y.o. male is here for a screening colonoscopy. Normal colonoscopy about 11 years ago. No bowel symptoms.  No family history of colon cancer.  Past Medical History:  Diagnosis Date  . GERD (gastroesophageal reflux disease)   . History of gastric ulcer REMOTE  . Hydrocele, left   . Hypercholesteremia   . Hyperlipidemia   . Hypertension   . 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 APPENDECTOMY  05-04-2004  . COLONOSCOPY   05/28/2006   OZH:YQMV papilla and internal hemorrhoids, otherwise normal rectum.  Diminutive left colon polyps removed , benign, due for screening 2018  . DONATED KIDNEY Left   . ESOPHAGOGASTRODUODENOSCOPY  1998   erosive reflux esophagitis  . HYDROCELE EXCISION  02/26/2012   Procedure: HYDROCELECTOMY ADULT;  Surgeon: Bernestine Amass, MD;  [facility];  Service: Urology;  Laterality: Left;  1 hour requested for this case  . INGUINAL HERNIA REPAIR Right 11/06/2016   Procedure: HERNIA REPAIR INGUINAL ADULT WITH MESH;  Surgeon: Aviva Signs, MD;  Location: [facility];  Service: General;  Laterality: Right;  . 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 REPAIR LEFT INGUINAL HERNIA  05-15-2005  DR Arnoldo Morale    Prior to Admission medications   Medication Sig Start Date End Date Taking? Authorizing Provider  acetaminophen (TYLENOL) 500 MG tablet Take 1,000 mg by mouth every 6 (six) hours as needed for moderate pain or headache.    Yes [provider]  B Complex-C (B-COMPLEX WITH VITAMIN C) tablet Take 1 tablet by mouth daily.   Yes [provider]  HYDROcodone-acetaminophen (NORCO/VICODIN) 5-325 MG tablet Take 1-2 tablets by mouth every 4 (four) hours as needed. Patient taking differently:  Take 1-2 tablets by mouth every 4 (four) hours as needed for moderate pain.  11/06/16  Yes Aviva Signs, MD  olmesartan (BENICAR) 5 MG tablet Take 5 mg by mouth daily.   Yes [provider]  polyethylene glycol-electrolytes (TRILYTE) 420 g solution Take 4,000 mLs by mouth as directed. 05/16/17  Yes Annitta Needs, NP  ranitidine (ZANTAC) 150 MG capsule Take 150 mg by mouth 2 (two) times daily.   Yes [provider]  simethicone (GAS-X) 80 MG chewable tablet Chew 80 mg by mouth every 6 (six) hours as needed for flatulence.   Yes [provider]  simvastatin (ZOCOR) 40 MG tablet Take 40 mg by mouth every evening.   Yes [provider]  vitamin C (ASCORBIC ACID) 500 MG tablet Take 500 mg by mouth daily.   Yes [provider]  zolpidem (AMBIEN) 10 MG tablet Take 5 mg by mouth at bedtime.   Yes [provider]  Carboxymethylcellul-Glycerin (LUBRICATING EYE DROPS OP) Apply 1 drop to eye daily as needed (dry eyes).    [provider]  diphenhydrAMINE (BENADRYL) 25 MG tablet Take 25 mg by mouth daily as needed for allergies.    [provider]    Allergies as of 05/16/2017 - Review Complete 05/16/2017  Allergen Reaction Noted  . Vytorin [ezetimibe-simvastatin] Nausea And Vomiting and Other (See Comments) 02/23/2012    Family History  Problem Relation Age of Onset  . Colon cancer Neg Hx     Social History  Socioeconomic History  . Marital status: Married    Spouse name: Not on file  . Number of children: Not on file  . Years of education: Not on file  . Highest education level: Not on file  Social Needs  . Financial resource strain: Not on file  . Food insecurity - worry: Not on file  . Food insecurity - inability: Not on file  . Transportation needs - medical: Not on file  . Transportation needs - non-medical: Not on file  Occupational History  . Not on file  Tobacco Use  . Smoking status: Never Smoker  . Smokeless  tobacco: Never Used  Substance and Sexual Activity  . Alcohol use: Yes    Comment: rarely  . Drug use: No  . Sexual activity: Yes    Birth control/protection: None  Other Topics Concern  . Not on file  Social History Narrative  . Not on file    Review of Systems: See HPI, otherwise negative ROS  Physical Exam: BP 129/78   Pulse 75   Temp 98.2 F (36.8 C) (Oral)   Resp 19   Ht 5\' 11"  (1.803 m)   Wt 180 lb (81.6 kg)   SpO2 98%   BMI 25.10 kg/m  General:   Alert,  Well-developed, well-nourished, pleasant and cooperative in NAD Lungs:  Clear throughout to auscultation.   No wheezes, crackles, or rhonchi. No acute distress. Heart:  Regular rate and rhythm; no murmurs, clicks, rubs,  or gallops. Abdomen:  Soft, nontender and nondistended. No masses, hepatosplenomegaly or hernias noted. Normal bowel sounds, without guarding, and without rebound.   Impression/Plan: Gregory George is now here to undergo a screening colonoscopy.  Average risk screening examination.  Risks, benefits, limitations, imponderables and alternatives regarding colonoscopy have been reviewed with the patient. Questions have been answered. All parties agreeable.     Notice:  This dictation was prepared with Dragon dictation along with smaller phrase technology. Any transcriptional errors that result from this process are unintentional and may not be corrected upon review.

## 2017-06-27 NOTE — Op Note (Signed)
[facility] Patient Name: Gregory George Procedure Date: 06/27/2017 8:07 AM MRN: [id number] Date of Birth: 1960/09/20 Attending MD: Norvel Richards , MD CSN: [id number] Age: 57 Admit Type: Outpatient Procedure:                Colonoscopy Indications:              Screening for colorectal malignant neoplasm Providers:                Norvel Richards, MD, Janeece Riggers, RN, Randa Spike, Technician Referring MD:              Medicines:                Midazolam 5 mg IV, Meperidine 75 mg IV Complications:            No immediate complications. Estimated Blood Loss:     Estimated blood loss was minimal. Estimated blood                            loss was minimal. Procedure:                Pre-Anesthesia Assessment:                           - Prior to the procedure, a History and Physical                            was performed, and patient medications and                            allergies were reviewed. The patient's tolerance of                            previous anesthesia was also reviewed. The risks                            and benefits of the procedure and the sedation                            options and risks were discussed with the patient.                            All questions were answered, and informed consent                            was obtained. Prior Anticoagulants: The patient has                            taken no previous anticoagulant or antiplatelet                            agents. ASA Grade Assessment: II - A patient with  mild systemic disease. After reviewing the risks                            and benefits, the patient was deemed in                            satisfactory condition to undergo the procedure.                           After obtaining informed consent, the colonoscope                            was passed under direct vision. Throughout the   procedure, the patient's blood pressure, pulse, and                            oxygen saturations were monitored continuously. The                            [id number]) scope was introduced through                            the anus and advanced to the the cecum, identified                            by appendiceal orifice and ileocecal valve. The                            colonoscopy was performed without difficulty. The                            patient tolerated the procedure well. The entire                            colon was well visualized. The ileocecal valve,                            appendiceal orifice, and rectum were photographed.                            The quality of the bowel preparation was adequate. Scope In: 8:25:42 AM Scope Out: 8:40:36 AM Scope Withdrawal Time: 0 hours 9 minutes 49 seconds  Total Procedure Duration: 0 hours 14 minutes 54 seconds  Findings:      A 4 mm polyp was found in the descending colon. The polyp was sessile.       The polyp was removed with a cold snare. Resection and retrieval were       complete. Estimated blood loss was minimal.      The exam was otherwise without abnormality on direct and retroflexion       views. Impression:               - One 4 mm polyp in the descending colon, removed  with a cold snare. Resected and retrieved.                           - The examination was otherwise normal on direct                            and retroflexion views. Moderate Sedation:      Moderate (conscious) sedation was personally administered by the       endoscopist. The following parameters were monitored: oxygen saturation,       heart rate, blood pressure, respiratory rate, EKG, adequacy of pulmonary       ventilation, and response to care. Total physician intraservice time was       20 minutes. Recommendation:           - Patient has a contact number available for                             emergencies. The signs and symptoms of potential                            delayed complications were discussed with the                            patient. Return to normal activities tomorrow.                            Written discharge instructions were provided to the                            patient.                           - Advance diet as tolerated.                           - Continue present medications.                           - Repeat colonoscopy for surveillance based on                            pathology results.                           - Return to GI clinic (date not yet determined). Procedure Code(s):        --- Professional ---                           [phone], Colonoscopy, flexible; with removal of                            tumor(s), polyp(s), or other lesion(s) by snare                            technique  73710, Moderate sedation services provided by the                            same physician or other qualified health care                            professional performing the diagnostic or                            therapeutic service that the sedation supports,                            requiring the presence of an independent trained                            observer to assist in the monitoring of the                            patient's level of consciousness and physiological                            status; initial 15 minutes of intraservice time,                            patient age 73 years or older Diagnosis Code(s):        --- Professional ---                           Z12.11, Encounter for screening for malignant                            neoplasm of colon                           D12.4, Benign neoplasm of descending colon CPT copyright 2016 American Medical Association. All rights reserved. The codes documented in this report are preliminary and upon coder review may  be revised to meet current compliance  requirements. Cristopher Estimable. Mallie Linnemann, MD Norvel Richards, MD 06/27/2017 8:57:04 AM This report has been signed electronically. Number of Addenda: 0

## 2017-06-27 NOTE — Discharge Instructions (Signed)
°Colonoscopy °Discharge Instructions ° °Read the instructions outlined below and refer to this sheet in the next few weeks. These discharge instructions provide you with general information on caring for yourself after you leave the hospital. Your doctor may also give you specific instructions. While your treatment has been planned according to the most current medical practices available, unavoidable complications occasionally occur. If you have any problems or questions after discharge, call Dr. Rourk at [phone]. °ACTIVITY °· You may resume your regular activity, but move at a slower pace for the next 24 hours.  °· Take frequent rest periods for the next 24 hours.  °· Walking will help get rid of the air and reduce the bloated feeling in your belly (abdomen).  °· No driving for 24 hours (because of the medicine (anesthesia) used during the test).   °· Do not sign any important legal documents or operate any machinery for 24 hours (because of the anesthesia used during the test).  °NUTRITION °· Drink plenty of fluids.  °· You may resume your normal diet as instructed by your doctor.  °· Begin with a light meal and progress to your normal diet. Heavy or fried foods are harder to digest and may make you feel sick to your stomach (nauseated).  °· Avoid alcoholic beverages for 24 hours or as instructed.  °MEDICATIONS °· You may resume your normal medications unless your doctor tells you otherwise.  °WHAT YOU CAN EXPECT TODAY °· Some feelings of bloating in the abdomen.  °· Passage of more gas than usual.  °· Spotting of blood in your stool or on the toilet paper.  °IF YOU HAD POLYPS REMOVED DURING THE COLONOSCOPY: °· No aspirin products for 7 days or as instructed.  °· No alcohol for 7 days or as instructed.  °· Eat a soft diet for the next 24 hours.  °FINDING OUT THE RESULTS OF YOUR TEST °Not all test results are available during your visit. If your test results are not back during the visit, make an appointment  with your caregiver to find out the results. Do not assume everything is normal if you have not heard from your caregiver or the medical facility. It is important for you to follow up on all of your test results.  °SEEK IMMEDIATE MEDICAL ATTENTION IF: °· You have more than a spotting of blood in your stool.  °· Your belly is swollen (abdominal distention).  °· You are nauseated or vomiting.  °· You have a temperature over 101.  °· You have abdominal pain or discomfort that is severe or gets worse throughout the day.  ° ° °Polyp information provided ° °Further recommendations to follow pending review of pathology report ° ° °Colon Polyps °Polyps are tissue growths inside the body. Polyps can grow in many places, including the large intestine (colon). A polyp may be a round bump or a mushroom-shaped growth. You could have one polyp or several. °Most colon polyps are noncancerous (benign). However, some colon polyps can become cancerous over time. °What are the causes? °The exact cause of colon polyps is not known. °What increases the risk? °This condition is more likely to develop in people who: °· Have a family history of colon cancer or colon polyps. °· Are older than 50 or older than 45 if they are African American. °· Have inflammatory bowel disease, such as ulcerative colitis or Crohn disease. °· Are overweight. °· Smoke cigarettes. °· Do not get enough exercise. °· Drink too much alcohol. °·   Eat a diet that is: °? High in fat and red meat. °? Low in fiber. °· Had childhood cancer that was treated with abdominal radiation. ° °What are the signs or symptoms? °Most polyps do not cause symptoms. If you have symptoms, they may include: °· Blood coming from your rectum when having a bowel movement. °· Blood in your stool. The stool may look dark red or black. °· A change in bowel habits, such as constipation or diarrhea. ° °How is this diagnosed? °This condition is diagnosed with a colonoscopy. This is a procedure  that uses a lighted, flexible scope to look at the inside of your colon. °How is this treated? °Treatment for this condition involves removing any polyps that are found. Those polyps will then be tested for cancer. If cancer is found, your health care provider will talk to you about options for colon cancer treatment. °Follow these instructions at home: °Diet °· Eat plenty of fiber, such as fruits, vegetables, and whole grains. °· Eat foods that are high in calcium and vitamin D, such as milk, cheese, yogurt, eggs, liver, fish, and broccoli. °· Limit foods high in fat, red meats, and processed meats, such as hot dogs, sausage, bacon, and lunch meats. °· Maintain a healthy weight, or lose weight if recommended by your health care provider. °General instructions °· Do not smoke cigarettes. °· Do not drink alcohol excessively. °· Keep all follow-up visits as told by your health care provider. This is important. This includes keeping regularly scheduled colonoscopies. Talk to your health care provider about when you need a colonoscopy. °· Exercise every day or as told by your health care provider. °Contact a health care provider if: °· You have new or worsening bleeding during a bowel movement. °· You have new or increased blood in your stool. °· You have a change in bowel habits. °· You unexpectedly lose weight. °This information is not intended to replace advice given to you by your health care provider. Make sure you discuss any questions you have with your health care provider. °Document Released: 12/29/2003 Document Revised: 09/09/2015 Document Reviewed: 02/22/2015 °Elsevier Interactive Patient Education © 2018 [employer] ° °

## 2017-06-28 DIAGNOSIS — M9902 Segmental and somatic dysfunction of thoracic region: Secondary | ICD-10-CM | POA: Diagnosis not present

## 2017-06-28 DIAGNOSIS — M5413 Radiculopathy, cervicothoracic region: Secondary | ICD-10-CM | POA: Diagnosis not present

## 2017-06-28 DIAGNOSIS — M9901 Segmental and somatic dysfunction of cervical region: Secondary | ICD-10-CM | POA: Diagnosis not present

## 2017-07-02 DIAGNOSIS — M9901 Segmental and somatic dysfunction of cervical region: Secondary | ICD-10-CM | POA: Diagnosis not present

## 2017-07-02 DIAGNOSIS — M5413 Radiculopathy, cervicothoracic region: Secondary | ICD-10-CM | POA: Diagnosis not present

## 2017-07-02 DIAGNOSIS — M9902 Segmental and somatic dysfunction of thoracic region: Secondary | ICD-10-CM | POA: Diagnosis not present

## 2017-07-03 ENCOUNTER — Encounter (HOSPITAL_COMMUNITY): Payer: Self-pay | Admitting: Internal Medicine

## 2017-07-05 DIAGNOSIS — M9902 Segmental and somatic dysfunction of thoracic region: Secondary | ICD-10-CM | POA: Diagnosis not present

## 2017-07-05 DIAGNOSIS — M9901 Segmental and somatic dysfunction of cervical region: Secondary | ICD-10-CM | POA: Diagnosis not present

## 2017-07-05 DIAGNOSIS — M5413 Radiculopathy, cervicothoracic region: Secondary | ICD-10-CM | POA: Diagnosis not present

## 2017-07-11 ENCOUNTER — Telehealth: Payer: Self-pay | Admitting: Internal Medicine

## 2017-07-11 NOTE — Telephone Encounter (Signed)
Pt is aware when results are given, we will notify him of the next TCS. A letter will also be mailed to pt about his next TCS.

## 2017-07-11 NOTE — Telephone Encounter (Signed)
Please advise when patient needs next colonoscopy.

## 2017-07-12 DIAGNOSIS — M5413 Radiculopathy, cervicothoracic region: Secondary | ICD-10-CM | POA: Diagnosis not present

## 2017-07-12 DIAGNOSIS — M9902 Segmental and somatic dysfunction of thoracic region: Secondary | ICD-10-CM | POA: Diagnosis not present

## 2017-07-12 DIAGNOSIS — M9901 Segmental and somatic dysfunction of cervical region: Secondary | ICD-10-CM | POA: Diagnosis not present

## 2017-07-19 DIAGNOSIS — M5413 Radiculopathy, cervicothoracic region: Secondary | ICD-10-CM | POA: Diagnosis not present

## 2017-07-19 DIAGNOSIS — M9901 Segmental and somatic dysfunction of cervical region: Secondary | ICD-10-CM | POA: Diagnosis not present

## 2017-07-19 DIAGNOSIS — M9902 Segmental and somatic dysfunction of thoracic region: Secondary | ICD-10-CM | POA: Diagnosis not present

## 2017-07-31 DIAGNOSIS — N183 Chronic kidney disease, stage 3 (moderate): Secondary | ICD-10-CM | POA: Diagnosis not present

## 2017-08-08 DIAGNOSIS — D631 Anemia in chronic kidney disease: Secondary | ICD-10-CM | POA: Diagnosis not present

## 2017-08-08 DIAGNOSIS — I10 Essential (primary) hypertension: Secondary | ICD-10-CM | POA: Diagnosis not present

## 2017-08-08 DIAGNOSIS — N183 Chronic kidney disease, stage 3 (moderate): Secondary | ICD-10-CM | POA: Diagnosis not present

## 2017-08-27 DIAGNOSIS — M5413 Radiculopathy, cervicothoracic region: Secondary | ICD-10-CM | POA: Diagnosis not present

## 2017-08-27 DIAGNOSIS — M9901 Segmental and somatic dysfunction of cervical region: Secondary | ICD-10-CM | POA: Diagnosis not present

## 2017-08-27 DIAGNOSIS — M9902 Segmental and somatic dysfunction of thoracic region: Secondary | ICD-10-CM | POA: Diagnosis not present

## 2017-11-30 DIAGNOSIS — M5413 Radiculopathy, cervicothoracic region: Secondary | ICD-10-CM | POA: Diagnosis not present

## 2017-11-30 DIAGNOSIS — M9901 Segmental and somatic dysfunction of cervical region: Secondary | ICD-10-CM | POA: Diagnosis not present

## 2017-11-30 DIAGNOSIS — M9902 Segmental and somatic dysfunction of thoracic region: Secondary | ICD-10-CM | POA: Diagnosis not present

## 2017-12-10 DIAGNOSIS — M9901 Segmental and somatic dysfunction of cervical region: Secondary | ICD-10-CM | POA: Diagnosis not present

## 2017-12-10 DIAGNOSIS — M5413 Radiculopathy, cervicothoracic region: Secondary | ICD-10-CM | POA: Diagnosis not present

## 2017-12-10 DIAGNOSIS — M9902 Segmental and somatic dysfunction of thoracic region: Secondary | ICD-10-CM | POA: Diagnosis not present

## 2018-01-30 ENCOUNTER — Telehealth: Payer: Self-pay | Admitting: Internal Medicine

## 2018-01-30 NOTE — Telephone Encounter (Signed)
PCP is wanting to know when next colonoscopy is due. Pt had last tcs in March 2019. Please advise so I can add to the recall and let PCP know.

## 2018-01-31 ENCOUNTER — Telehealth: Payer: Self-pay

## 2018-01-31 NOTE — Telephone Encounter (Signed)
-----   Message from Daneil Dolin, MD sent at 01/31/2018  2:05 PM EDT ----- Patient had a tiny adenoma-benign polyp.  Please let PCP and patient know I recommend next colonoscopy in 7 years.  Thanks. ----- Message ----- From: Claudina Lick, LPN Sent: 21/82/8833   2:00 PM EDT To: Daneil Dolin, MD, Derrick Ravel, CMA  Dr.Rourk, the pcp is calling wanting to know when this pt is due for his next tcs. It doesn't look like you did a letter for him and his tcs was in March. Can you look at this please.

## 2018-01-31 NOTE — Telephone Encounter (Signed)
Message was sent to RMR. Waiting on a response.

## 2018-02-01 DIAGNOSIS — N2581 Secondary hyperparathyroidism of renal origin: Secondary | ICD-10-CM | POA: Diagnosis not present

## 2018-02-01 DIAGNOSIS — N183 Chronic kidney disease, stage 3 (moderate): Secondary | ICD-10-CM | POA: Diagnosis not present

## 2018-02-01 NOTE — Telephone Encounter (Signed)
Reminder in epic °

## 2018-02-01 NOTE — Telephone Encounter (Signed)
Noted  

## 2018-02-05 NOTE — Telephone Encounter (Signed)
Closing note out. Gregory George is aware that pts next TCS is in 7 years per RMR/ .

## 2018-02-08 DIAGNOSIS — D631 Anemia in chronic kidney disease: Secondary | ICD-10-CM | POA: Diagnosis not present

## 2018-02-08 DIAGNOSIS — I10 Essential (primary) hypertension: Secondary | ICD-10-CM | POA: Diagnosis not present

## 2018-02-08 DIAGNOSIS — N183 Chronic kidney disease, stage 3 (moderate): Secondary | ICD-10-CM | POA: Diagnosis not present

## 2018-02-20 DIAGNOSIS — N183 Chronic kidney disease, stage 3 (moderate): Secondary | ICD-10-CM | POA: Diagnosis not present

## 2018-02-20 DIAGNOSIS — Z6825 Body mass index (BMI) 25.0-25.9, adult: Secondary | ICD-10-CM | POA: Diagnosis not present

## 2018-02-20 DIAGNOSIS — Z1389 Encounter for screening for other disorder: Secondary | ICD-10-CM | POA: Diagnosis not present

## 2018-02-20 DIAGNOSIS — Z0001 Encounter for general adult medical examination with abnormal findings: Secondary | ICD-10-CM | POA: Diagnosis not present

## 2018-02-20 DIAGNOSIS — E748 Other specified disorders of carbohydrate metabolism: Secondary | ICD-10-CM | POA: Diagnosis not present

## 2018-02-20 DIAGNOSIS — E663 Overweight: Secondary | ICD-10-CM | POA: Diagnosis not present

## 2018-05-14 DIAGNOSIS — M9902 Segmental and somatic dysfunction of thoracic region: Secondary | ICD-10-CM | POA: Diagnosis not present

## 2018-05-14 DIAGNOSIS — M545 Low back pain: Secondary | ICD-10-CM | POA: Diagnosis not present

## 2018-05-14 DIAGNOSIS — M9903 Segmental and somatic dysfunction of lumbar region: Secondary | ICD-10-CM | POA: Diagnosis not present
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
## 2018-09-12 ENCOUNTER — Other Ambulatory Visit (HOSPITAL_COMMUNITY): Payer: Self-pay | Admitting: Internal Medicine

## 2018-09-12 DIAGNOSIS — I739 Peripheral vascular disease, unspecified: Secondary | ICD-10-CM

## 2018-09-18 ENCOUNTER — Other Ambulatory Visit: Payer: Self-pay

## 2018-09-18 ENCOUNTER — Ambulatory Visit (HOSPITAL_COMMUNITY)
Admission: RE | Admit: 2018-09-18 | Discharge: 2018-09-18 | Disposition: A | Discharge status: Home / Self Care | Payer: 59 | Source: Ambulatory Visit | Attending: Internal Medicine | Admitting: Internal Medicine

## 2018-09-18 DIAGNOSIS — I739 Peripheral vascular disease, unspecified: Secondary | ICD-10-CM | POA: Insufficient documentation

## 2019-03-04 ENCOUNTER — Telehealth: Payer: Self-pay | Admitting: *Deleted

## 2019-03-04 NOTE — Telephone Encounter (Signed)
A message was left, re: his new patient appointment. 

## 2019-03-05 ENCOUNTER — Ambulatory Visit: Payer: 59 | Admitting: Cardiology

## 2019-03-05 ENCOUNTER — Other Ambulatory Visit: Payer: Self-pay

## 2019-03-05 ENCOUNTER — Encounter: Payer: Self-pay | Admitting: Cardiology

## 2019-03-05 VITALS — BP 140/72 | HR 58 | Ht 71.0 in | Wt 187.2 lb

## 2019-03-05 DIAGNOSIS — E785 Hyperlipidemia, unspecified: Secondary | ICD-10-CM

## 2019-03-05 DIAGNOSIS — Z8249 Family history of ischemic heart disease and other diseases of the circulatory system: Secondary | ICD-10-CM | POA: Diagnosis not present

## 2019-03-05 DIAGNOSIS — R0602 Shortness of breath: Secondary | ICD-10-CM | POA: Diagnosis not present

## 2019-03-05 DIAGNOSIS — I10 Essential (primary) hypertension: Secondary | ICD-10-CM | POA: Diagnosis not present

## 2019-03-05 NOTE — Progress Notes (Signed)
Cardiology Office Note:    Date:  03/05/2019   ID:  Gregory George, DOB 1961/02/11, MRN [id number]  PCP:  Redmond School, MD  Cardiologist:  No primary care provider on file.  Electrophysiologist:  None   Referring MD: Redmond School, MD   Chief Complaint  Patient presents with  . Shortness of Breath    History of Present Illness:    [name] is a 58 y.o. male with a hx of hypertension, hyperlipidemia, CKD who is referred by Dr. Gerarda Fraction for evaluation of hypertension and shortness of breath.  He reports that he has noted worsening shortness of breath.  States that it is intermittent.  Last week he cut wood for 4 to 5 hours and had no symptoms.  However with minimal activity next day he did feel some shortness of breath.  Reports that he walked around [place] last week, several miles per day, and had no symptoms.  He denies any chest pain.  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  He follows with nephrology.  On olmesartan 5 mg daily and amlodpine 5 mg daily.  Reports that BP has been 120s/70s when checks at work.  Used to smoke 0-2 cigarettes per day for 10 years, quit 10 years ago.  Father had CABG in 34s.      Past Medical History:  Diagnosis Date  . GERD (gastroesophageal reflux disease)   . History of gastric ulcer REMOTE  . Hydrocele, left   . Hypercholesteremia   . Hyperlipidemia   . Hypertension   . 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 APPENDECTOMY  05-04-2004  . COLONOSCOPY   05/28/2006   [id number] papilla and internal hemorrhoids, otherwise normal rectum.  Diminutive left colon polyps removed , benign, due for screening 2018  . COLONOSCOPY N/A 06/27/2017   Procedure: COLONOSCOPY;  Surgeon: Daneil Dolin, MD;  Location: [facility];  Service: Endoscopy;  Laterality: N/A;  8:30  . DONATED KIDNEY Left   . ESOPHAGOGASTRODUODENOSCOPY  1998   erosive reflux esophagitis  . HYDROCELE EXCISION  02/26/2012   Procedure: HYDROCELECTOMY ADULT;  Surgeon: Bernestine Amass, MD;  Location[facility];  Service: Urology;  Laterality: Left;  1 hour requested for this case  . INGUINAL HERNIA REPAIR Right 11/06/2016   Procedure: HERNIA REPAIR INGUINAL ADULT WITH MESH;  Surgeon: Aviva Signs, MD;  Location: [facility];  Service: General;  Laterality: Right;  . 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 REPAIR LEFT INGUINAL HERNIA  05-15-2005  DR Arnoldo Morale    Current Medications: Current Meds  Medication Sig  . acetaminophen (TYLENOL) 500 MG tablet Take 1,000 mg by mouth every 6 (six) hours as needed for moderate pain or headache.   Marland Kitchen amLODipine (NORVASC) 5 MG tablet Take 5 mg by mouth daily.  . B Complex-C (B-COMPLEX WITH VITAMIN C) tablet Take 1 tablet by mouth daily.  . Coenzyme Q10 100 MG capsule Take 200 mg by mouth daily.  . diphenhydrAMINE (BENADRYL) 25 MG tablet Take 25 mg by mouth daily as needed for allergies.  Marland Kitchen HYDROcodone-acetaminophen (NORCO/VICODIN) 5-325 MG tablet Take 1-2 tablets by mouth every 4 (four) hours as needed. (Patient taking differently: Take 1-2 tablets by mouth every 4 (four) hours as needed for moderate pain. )  . olmesartan (BENICAR) 5 MG tablet Take 5 mg by mouth daily.  Marland Kitchen  pantoprazole (PROTONIX) 40 MG tablet Take 40 mg by mouth daily.  . simethicone (GAS-X) 80 MG chewable tablet Chew 80 mg by mouth every 6 (six) hours as needed for flatulence.  . simvastatin (ZOCOR) 40 MG tablet Take 40 mg by mouth every evening.  . vitamin C (ASCORBIC ACID) 500 MG tablet Take 500 mg by mouth daily.  Marland Kitchen zolpidem (AMBIEN) 10 MG tablet Take 5 mg by mouth at bedtime.     Allergies:   Vytorin [ezetimibe-simvastatin]   Social History   Socioeconomic History  . Marital status: Married    Spouse name: Not on file  . Number of children: Not on file  . Years of education: Not on file  . Highest education level:  Not on file  Occupational History  . Not on file  Social Needs  . Financial resource strain: Not on file  . Food insecurity    Worry: Not on file    Inability: Not on file  . Transportation needs    Medical: Not on file    Non-medical: Not on file  Tobacco Use  . Smoking status: Never Smoker  . Smokeless tobacco: Never Used  Substance and Sexual Activity  . Alcohol use: Yes    Comment: rarely  . Drug use: No  . Sexual activity: Yes    Birth control/protection: None  Lifestyle  . Physical activity    Days per week: Not on file    Minutes per session: Not on file  . Stress: Not on file  Relationships  . Social Herbalist on phone: Not on file    Gets together: Not on file    Attends religious service: Not on file    Active member of club or organization: Not on file    Attends meetings of clubs or organizations: Not on file    Relationship status: Not on file  Other Topics Concern  . Not on file  Social History Narrative  . Not on file     Family History: Father had CABG in 30s  ROS:   Please see the history of present illness.    All other systems reviewed and are negative.  EKGs/Labs/Other Studies Reviewed:    The following studies were reviewed today:   EKG:  EKG is ordered today.  The ekg ordered today demonstrates sinus rhythm, rate 58, no ST/T abnormalities  Recent Labs: No results found for requested labs within last 8760 hours.  Recent Lipid Panel No results found for: CHOL, TRIG, HDL, CHOLHDL, VLDL, LDLCALC, LDLDIRECT  Physical Exam:    VS:  BP 140/72   Pulse (!) 58   Ht 5\' 11"  (1.803 m)   Wt 187 lb 3.2 oz (84.9 kg)   BMI 26.11 kg/m     Wt Readings from Last 3 Encounters:  03/05/19 187 lb 3.2 oz (84.9 kg)  06/27/17 180 lb (81.6 kg)  04/05/17 176 lb (79.8 kg)     GEN: Well nourished, well developed in no acute distress HEENT: Normal NECK: No JVD; No carotid bruits LYMPHATICS: No lymphadenopathy CARDIAC: RRR, no murmurs,  rubs, gallops RESPIRATORY:  Clear to auscultation without rales, wheezing or rhonchi  ABDOMEN: Soft, non-tender, non-distended MUSCULOSKELETAL:  No edema; No deformity  SKIN: Warm and dry NEUROLOGIC:  Alert and oriented x 3 PSYCHIATRIC:  Normal affect   ASSESSMENT:    1. SOB (shortness of breath)   2. Hyperlipidemia, unspecified hyperlipidemia type   3. Essential hypertension   4. Family history of  heart disease    PLAN:    In order of problems listed above:  Dyspnea: Reports intermittent dyspnea with exertion.  Denies any exertional chest pain.  Will check TTE to evaluate for structural heart disease  Hyperlipidemia: LDL 104 in 03/03/2019.  On simvastatin 40 mg daily.  10-year ASCVD risk score is 7.4%; he is borderline for meeting indication for higher intensity statin.  Will check calcium score and if significant plaque burden will change to high intensity statin.  Hypertension: On olmesartan 5 mg daily and amlodipine 5 mg daily.  Mildly elevated in clinic today, but reports has been under good control when he checks at work.   RTC in 3 months   Medication Adjustments/Labs and Tests Ordered: Current medicines are reviewed at length with the patient today.  Concerns regarding medicines are outlined above.  Orders Placed This Encounter  Procedures  . CT CARDIAC SCORING  . EKG 12-Lead  . ECHOCARDIOGRAM COMPLETE   No orders of the defined types were placed in this encounter.   Patient Instructions  Medication Instructions:  The current medical regimen is effective;  continue present plan and medications as directed. Please refer to the Current Medication list given to you today. If you need a refill on your cardiac medications before your next appointment, please call your pharmacy.  Testing/Procedures: Your physician has requested that you have an echocardiogram. Echocardiography is a painless test that uses sound waves to create images of your heart. It provides your  doctor with information about the size and shape of your heart and how well your heart's chambers and valves are working. This procedure takes approximately one hour. There are no restrictions for this procedure.  Your physician has requested that you have cardiac CT scoring. This is not a covered test the cost for this test is $150.00 Cardiac computed tomography (CT) is a painless test that uses an x-ray machine to take clear, detailed pictures of your heart. For further information please visit [employer]uge[employer]iest[employer]tn. Please follow instruction sheet as given.  Follow-Up: IN 3 months  In Person Oswaldo Milian, MD.    At [facility], you and your health needs are our priority.  As part of our continuing mission to provide you with exceptional heart care, we have created designated Provider Care Teams.  These Care Teams include your primary Cardiologist (physician) and Advanced Practice Providers (APPs -  Physician Assistants and Nurse Practitioners) who all work together to provide you with the care you need, when you need it.  Thank you for choosing [facility] at [facility]!!          Signed, Donato Heinz, MD  03/05/2019 12:51 PM    [place]

## 2019-03-05 NOTE — Patient Instructions (Signed)
Medication Instructions:  The current medical regimen is effective;  continue present plan and medications as directed. Please refer to the Current Medication list given to you today. If you need a refill on your cardiac medications before your next appointment, please call your pharmacy.  Testing/Procedures: Your physician has requested that you have an echocardiogram. Echocardiography is a painless test that uses sound waves to create images of your heart. It provides your doctor with information about the size and shape of your heart and how well your heart's chambers and valves are working. This procedure takes approximately one hour. There are no restrictions for this procedure.  Your physician has requested that you have cardiac CT scoring. This is not a covered test the cost for this test is $150.00 Cardiac computed tomography (CT) is a painless test that uses an x-ray machine to take clear, detailed pictures of your heart. For further information please visit [employer]tn. Please follow instruction sheet as given.  Follow-Up: IN 3 months  In Person Oswaldo Milian, MD.    At [facility], you and your health needs are our priority.  As part of our continuing mission to provide you with exceptional heart care, we have created designated Provider Care Teams.  These Care Teams include your primary Cardiologist (physician) and Advanced Practice Providers (APPs -  Physician Assistants and Nurse Practitioners) who all work together to provide you with the care you need, when you need it.  Thank you for choosing [facility] at [facility]!!

## 2019-03-19 ENCOUNTER — Ambulatory Visit (HOSPITAL_COMMUNITY): Payer: 59 | Attending: Cardiovascular Disease

## 2019-03-19 ENCOUNTER — Other Ambulatory Visit: Payer: Self-pay

## 2019-03-19 ENCOUNTER — Ambulatory Visit (INDEPENDENT_AMBULATORY_CARE_PROVIDER_SITE_OTHER)
Admission: RE | Admit: 2019-03-19 | Discharge: 2019-03-19 | Disposition: A | Discharge status: Home / Self Care | Payer: Self-pay | Source: Ambulatory Visit | Attending: Cardiology | Admitting: Cardiology

## 2019-03-19 DIAGNOSIS — R0602 Shortness of breath: Secondary | ICD-10-CM | POA: Insufficient documentation

## 2019-03-19 DIAGNOSIS — Z8249 Family history of ischemic heart disease and other diseases of the circulatory system: Secondary | ICD-10-CM

## 2019-06-02 ENCOUNTER — Ambulatory Visit: Payer: 59 | Admitting: Cardiology

## 2019-06-03 ENCOUNTER — Other Ambulatory Visit: Payer: Self-pay

## 2019-06-03 ENCOUNTER — Ambulatory Visit: Payer: 59 | Admitting: Cardiology

## 2019-06-03 VITALS — BP 132/80 | HR 63 | Ht 71.0 in | Wt 183.0 lb

## 2019-06-03 DIAGNOSIS — E785 Hyperlipidemia, unspecified: Secondary | ICD-10-CM | POA: Diagnosis not present

## 2019-06-03 DIAGNOSIS — I10 Essential (primary) hypertension: Secondary | ICD-10-CM

## 2019-06-03 DIAGNOSIS — R911 Solitary pulmonary nodule: Secondary | ICD-10-CM | POA: Diagnosis not present

## 2019-06-03 DIAGNOSIS — R0602 Shortness of breath: Secondary | ICD-10-CM

## 2019-06-03 NOTE — Patient Instructions (Signed)
Medication Instructions:  Your physician recommends that you continue on your current medications as directed. Please refer to the Current Medication list given to you today.  *If you need a refill on your cardiac medications before your next appointment, please call your pharmacy*  Lab Work: NONE  Testing/Procedures:  CT chest without contrast in 1 year: Non-Cardiac CT scanning, (CAT scanning), is a noninvasive, special x-ray that produces cross-sectional images of the body using x-rays and a computer. CT scans help physicians diagnose and treat medical conditions. For some CT exams, a contrast material is used to enhance visibility in the area of the body being studied. CT scans provide greater clarity and reveal more details than regular x-ray exams.  Follow-Up: At [facility], you and your health needs are our priority.  As part of our continuing mission to provide you with exceptional heart care, we have created designated Provider Care Teams.  These Care Teams include your primary Cardiologist (physician) and Advanced Practice Providers (APPs -  Physician Assistants and Nurse Practitioners) who all work together to provide you with the care you need, when you need it.  Your next appointment:   12 month(s)  The format for your next appointment:   In Person  Provider:   Oswaldo Milian, MD

## 2019-06-03 NOTE — Progress Notes (Signed)
Cardiology Office Note:    Date:  06/03/2019   ID:  Gregory George, DOB 1961/01/03, MRN [id number]  PCP:  Redmond School, MD  Cardiologist:  No primary care provider on file.  Electrophysiologist:  None   Referring MD: Redmond School, MD   Chief Complaint  Patient presents with  . Shortness of Breath    History of Present Illness:    Gregory George is a 59 y.o. male with a hx of hypertension, hyperlipidemia, CKD who presents for follow-up shortness of breath.  He reports that he has noted worsening shortness of breath.  States that it is intermittent, sometimes occurs with minimal exertion.  He denies any chest pain.  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  He follows with nephrology.  On olmesartan 5 mg daily and amlodpine 5 mg daily.  Used to smoke 0-2 cigarettes per day for 10 years, quit 10 years ago.  Father had CABG in 52s.    TTE was done on 03/19/2019, which showed normal LV systolic function, abnormal septal motion, normal RV function, no significant valvular disease.  Calcium score on 03/20/2019 was 0.  Since last clinic visit, he reports dyspnea has improved.  Denies any chest pain.  Does remain very busy, working about 80 hours/week.  He works in a [employer] and is able to everything he needs to do for work.  Has not been exercising outside of work.   Past Medical History:  Diagnosis Date  . GERD (gastroesophageal reflux disease)   . History of gastric ulcer REMOTE  . Hydrocele, left   . Hypercholesteremia   . Hyperlipidemia   . Hypertension   . 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 APPENDECTOMY  05-04-2004  . COLONOSCOPY   05/28/2006   [id number] papilla and internal hemorrhoids, otherwise normal rectum.  Diminutive left colon polyps removed , benign, due for screening 2018  . COLONOSCOPY N/A 06/27/2017   Procedure: COLONOSCOPY;  Surgeon: Daneil Dolin, MD;  Location: [facility];  Service: Endoscopy;  Laterality: N/A;  8:30  . DONATED KIDNEY Left   . ESOPHAGOGASTRODUODENOSCOPY  1998   erosive reflux esophagitis  . HYDROCELE EXCISION  02/26/2012   Procedure: HYDROCELECTOMY ADULT;  Surgeon: Bernestine Amass, MD;  Location: [facility];  Service: Urology;  Laterality: Left;  1 hour requested for this case  . INGUINAL HERNIA REPAIR Right 11/06/2016   Procedure: HERNIA REPAIR INGUINAL ADULT WITH MESH;  Surgeon: Aviva Signs, MD;  Location: [facility];  Service: General;  Laterality: Right;  . 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 REPAIR LEFT INGUINAL HERNIA  05-15-2005  DR Arnoldo Morale    Current Medications: No outpatient medications have been marked as taking for the 06/03/19 encounter (Office Visit) with Donato Heinz, MD.     Allergies:   Vytorin [ezetimibe-simvastatin]   Social History   Socioeconomic History  . Marital status: Married    Spouse name: Not on file  . Number of children: Not on file  . Years of education: Not on file  . Highest education level: Not on file  Occupational History  . Not on file  Tobacco Use  . Smoking status: Never Smoker  . Smokeless tobacco: Never Used  Substance and Sexual Activity  . Alcohol use: Yes    Comment: rarely  . Drug use: No  .  Sexual activity: Yes    Birth control/protection: None  Other Topics Concern  . Not on file  Social History Narrative  . Not on file   Social Determinants of Health   Financial Resource Strain:   . Difficulty of Paying Living Expenses: Not on file  Food Insecurity:   . Worried About Charity fundraiser in the Last Year: Not on file  . Ran Out of Food in the Last Year: Not on file  Transportation Needs:   . Lack of Transportation (Medical): Not on file  . Lack of Transportation (Non-Medical): Not on file  Physical Activity:   . Days of Exercise per Week: Not on file  . Minutes of Exercise per Session: Not  on file  Stress:   . Feeling of Stress : Not on file  Social Connections:   . Frequency of Communication with Friends and Family: Not on file  . Frequency of Social Gatherings with Friends and Family: Not on file  . Attends Religious Services: Not on file  . Active Member of Clubs or Organizations: Not on file  . Attends Archivist Meetings: Not on file  . Marital Status: Not on file     Family History: Father had CABG in 30s  ROS:   Please see the history of present illness.    All other systems reviewed and are negative.  EKGs/Labs/Other Studies Reviewed:    The following studies were reviewed today:   EKG:  EKG is ordered today.  The ekg ordered today demonstrates sinus rhythm, rate 58, no ST/T abnormalities  Recent Labs: No results found for requested labs within last 8760 hours.  Recent Lipid Panel No results found for: CHOL, TRIG, HDL, CHOLHDL, VLDL, LDLCALC, LDLDIRECT   Calcium score 03/20/19: IMPRESSION: Coronary calcium score of 0  Noncardiac: IMPRESSION: 1.  No acute findings in the imaged extracardiac chest. 2.  Tiny hiatal hernia. 3. 5 mm soft tissue density in the deep right costophrenic angle most likely represents an area of scarring or subsegmental atelectasis. A pulmonary nodule cannot be excluded. No follow-up needed if patient is low-risk. Non-contrast chest CT can be considered in 12 months if patient is high-risk. This recommendation follows the consensus statement: Guidelines for Management of Incidental Pulmonary Nodules Detected on CT Images: From the Fleischner Society 2017; Radiology 2017; 284:228-243.   Physical Exam:    VS:  BP 132/80   Pulse 63   Ht 5\' 11"  (1.803 m)   Wt 183 lb (83 kg)   BMI 25.52 kg/m     Wt Readings from Last 3 Encounters:  06/03/19 183 lb (83 kg)  03/05/19 187 lb 3.2 oz (84.9 kg)  06/27/17 180 lb (81.6 kg)     GEN: Well nourished, well developed in no acute distress HEENT: Normal NECK: No JVD;  No carotid bruits LYMPHATICS: No lymphadenopathy CARDIAC: RRR, no murmurs, rubs, gallops RESPIRATORY:  Clear to auscultation without rales, wheezing or rhonchi  ABDOMEN: Soft, non-tender, non-distended MUSCULOSKELETAL:  No edema; No deformity  SKIN: Warm and dry NEUROLOGIC:  Alert and oriented x 3 PSYCHIATRIC:  Normal affect   ASSESSMENT:    1. Lung nodule   2. SOB (shortness of breath)   3. Essential hypertension   4. Hyperlipidemia, unspecified hyperlipidemia type    PLAN:     Dyspnea: Reports intermittent dyspnea with exertion.  Denies any exertional chest pain.  TTE shows no significant abnormalities  Hyperlipidemia: LDL 104 in 03/03/2019.  On simvastatin 40 mg daily.  10-year ASCVD risk score is 7.4%; he is borderline for meeting indication for higher intensity statin.  Calcium score is 0 on 04/16/2019, so no indication for increasing statin dose  Hypertension: On olmesartan 5 mg daily and amlodipine 5 mg daily.  Appears controlled  Possible pulmonary nodule: CT chest 03/20/19 showed 72mm soft tissue density in the deep right costophrenic angle that most likely represents an area of scarring or subsegmental atelectasis but a pulmonary nodule cannot be excluded.  Repeat CT chest in  1 year  RTC in 1 year   Medication Adjustments/Labs and Tests Ordered: Current medicines are reviewed at length with the patient today.  Concerns regarding medicines are outlined above.  Orders Placed This Encounter  Procedures  . CT Chest Wo Contrast   No orders of the defined types were placed in this encounter.   Patient Instructions  Medication Instructions:  Your physician recommends that you continue on your current medications as directed. Please refer to the Current Medication list given to you today.  *If you need a refill on your cardiac medications before your next appointment, please call your pharmacy*  Lab Work: NONE  Testing/Procedures:  CT chest without contrast in 1  year: Non-Cardiac CT scanning, (CAT scanning), is a noninvasive, special x-ray that produces cross-sectional images of the body using x-rays and a computer. CT scans help physicians diagnose and treat medical conditions. For some CT exams, a contrast material is used to enhance visibility in the area of the body being studied. CT scans provide greater clarity and reveal more details than regular x-ray exams.  Follow-Up: At [facility], you and your health needs are our priority.  As part of our continuing mission to provide you with exceptional heart care, we have created designated Provider Care Teams.  These Care Teams include your primary Cardiologist (physician) and Advanced Practice Providers (APPs -  Physician Assistants and Nurse Practitioners) who all work together to provide you with the care you need, when you need it.  Your next appointment:   12 month(s)  The format for your next appointment:   In Person  Provider:   Oswaldo Milian, MD       Signed, Donato Heinz, MD  06/03/2019 3:51 PM    [facility]

## 2019-06-06 ENCOUNTER — Telehealth: Payer: Self-pay | Admitting: *Deleted

## 2019-06-06 NOTE — Telephone Encounter (Signed)
Left message for patient to call and schedule Chest CT

## 2019-06-10 NOTE — Telephone Encounter (Signed)
Left message for patient to call and schedule CT of chest ordered by Dr. Gardiner Rhyme

## 2019-06-12 NOTE — Telephone Encounter (Signed)
Left message for patient to call and schedule Chest CT without contrast ordered by Dr. Gardiner Rhyme

## 2019-06-13 NOTE — Telephone Encounter (Signed)
Patient aware of appointment for CT chest no contrast scheduled Tuesday 06/24/19 at 9:30 am at [facility]--[address].  Arrival time is 9:00 am---no restrictions

## 2019-06-24 ENCOUNTER — Ambulatory Visit (INDEPENDENT_AMBULATORY_CARE_PROVIDER_SITE_OTHER)
Admission: RE | Admit: 2019-06-24 | Discharge: 2019-06-24 | Disposition: A | Discharge status: Home / Self Care | Payer: 59 | Source: Ambulatory Visit | Attending: Cardiology | Admitting: Cardiology

## 2019-06-24 ENCOUNTER — Other Ambulatory Visit: Payer: Self-pay

## 2019-06-24 DIAGNOSIS — R911 Solitary pulmonary nodule: Secondary | ICD-10-CM

## 2019-07-09 ENCOUNTER — Telehealth: Payer: Self-pay | Admitting: Cardiology

## 2019-07-09 NOTE — Telephone Encounter (Signed)
New message   Patient is returning call for CT Chest results. Please call.

## 2019-07-09 NOTE — Telephone Encounter (Signed)
Patient made aware of results and verbalized understanding.   According to Dr. Gardiner Rhyme:  Previously seen nodule has resolved, suggesting it was likely atelectasis, no further work-up needed

## 2021-10-11 ENCOUNTER — Encounter: Payer: Self-pay | Admitting: Cardiology
